# Patient Record
Sex: Male | Born: 1975 | Hispanic: Yes | Marital: Married | Smoking: Never smoker
Health system: Southern US, Community
[De-identification: ages and names within clinical notes are randomized; demographics above are authoritative.]

## PROBLEM LIST (undated history)

## (undated) DIAGNOSIS — E785 Hyperlipidemia, unspecified: Secondary | ICD-10-CM

## (undated) DIAGNOSIS — R569 Unspecified convulsions: Secondary | ICD-10-CM

## (undated) DIAGNOSIS — I10 Essential (primary) hypertension: Secondary | ICD-10-CM

## (undated) DIAGNOSIS — F39 Unspecified mood [affective] disorder: Secondary | ICD-10-CM

## (undated) HISTORY — DX: Hyperlipidemia, unspecified: E78.5

## (undated) HISTORY — DX: Unspecified convulsions: R56.9

## (undated) HISTORY — DX: Unspecified mood (affective) disorder: F39

---

## 1999-01-21 ENCOUNTER — Emergency Department (HOSPITAL_COMMUNITY): Admission: EM | Admit: 1999-01-21 | Discharge: 1999-01-21 | Payer: Self-pay | Admitting: Emergency Medicine

## 2001-12-07 HISTORY — PX: APPENDECTOMY: SHX54

## 2002-09-01 ENCOUNTER — Encounter: Payer: Self-pay | Admitting: Emergency Medicine

## 2002-09-02 ENCOUNTER — Inpatient Hospital Stay (HOSPITAL_COMMUNITY): Admission: EM | Admit: 2002-09-02 | Discharge: 2002-09-03 | Payer: Self-pay | Admitting: Emergency Medicine

## 2007-10-28 ENCOUNTER — Emergency Department (HOSPITAL_COMMUNITY): Admission: EM | Admit: 2007-10-28 | Discharge: 2007-10-28 | Payer: Self-pay | Admitting: Emergency Medicine

## 2007-11-06 ENCOUNTER — Emergency Department (HOSPITAL_COMMUNITY): Admission: EM | Admit: 2007-11-06 | Discharge: 2007-11-06 | Payer: Self-pay | Admitting: Emergency Medicine

## 2009-10-28 IMAGING — CT CT UROGRAM
2 of 3 series · 15 of 42 positions shown, 20 images · non-contrast
Comparison: NONE

CLINICAL DATA: Abdominal pain, burning with urination.  Evaluate 
for  kidney stones. 

CT UROGRAM
TECHNIQUE: Thin-section unenhanced axial images were obtained to 
provide a CT urogram to evaluate for possible urinary tract stone. 
 Scan thicknesses were 3 mm with 3-mm increments.

[Series 2: wo · axial · 0.72mm/px · z∈[+64,+421]mm · 12 of 134 slices shown, 16 images]
[im 8/134  soft-tissue]
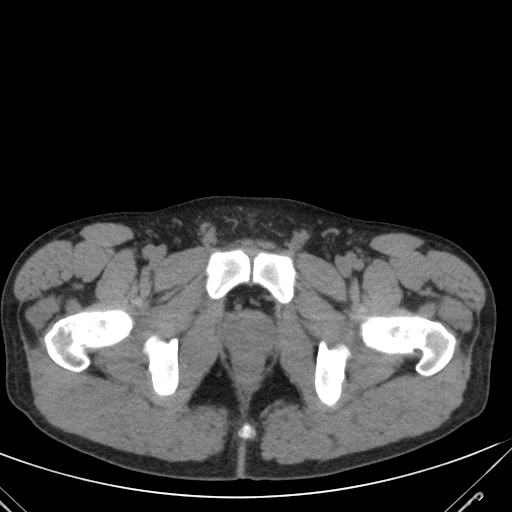
[im 8/134  bone]
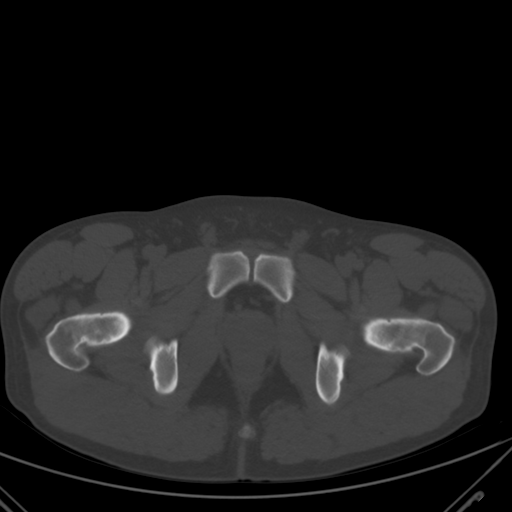
[im 22/134  soft-tissue]
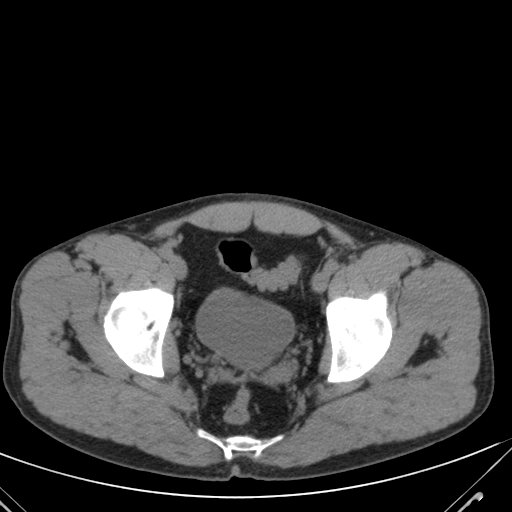
[im 36/134  soft-tissue]
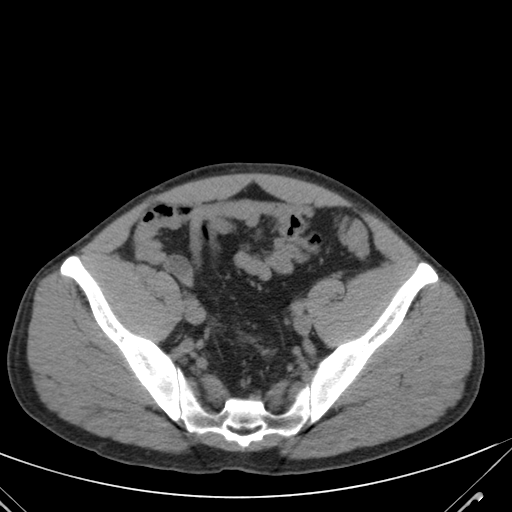
[im 50/134  soft-tissue]
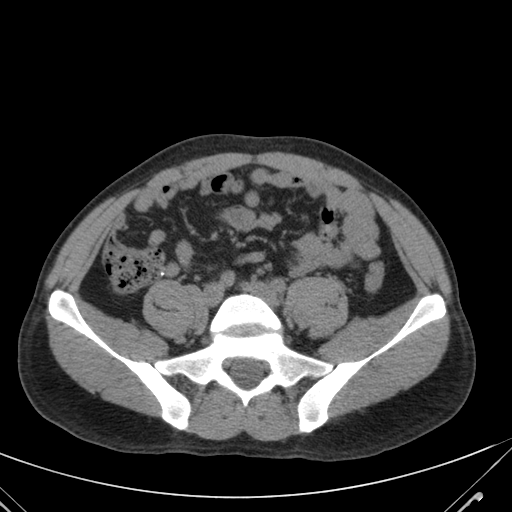
[im 64/134  soft-tissue]
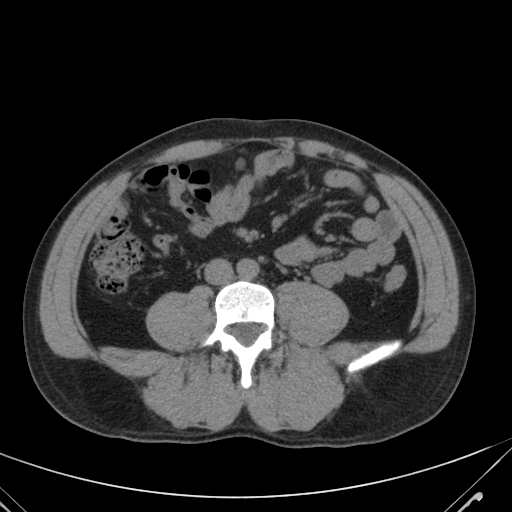
[im 71/134  soft-tissue]
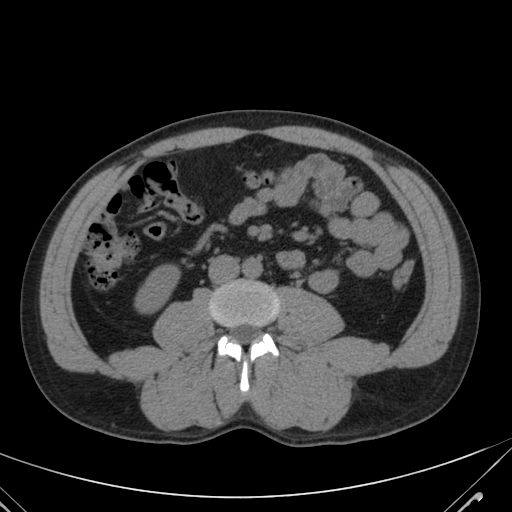
[im 85/134  soft-tissue]
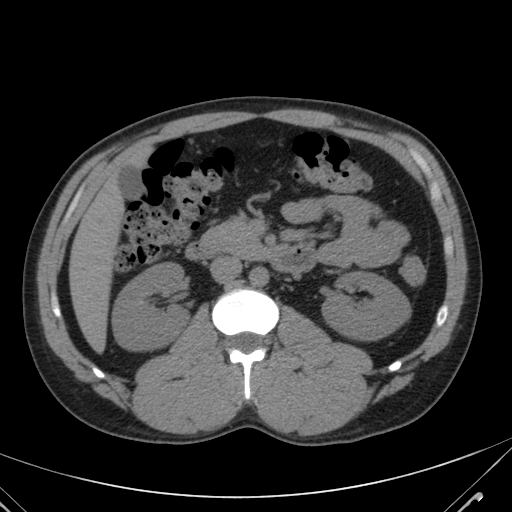
[im 99/134  soft-tissue]
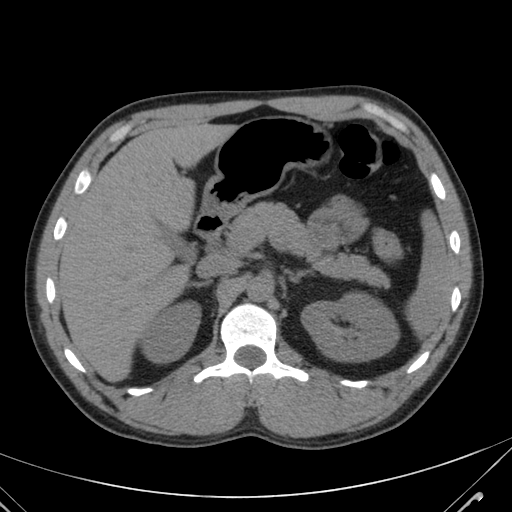
[im 106/134  lung]
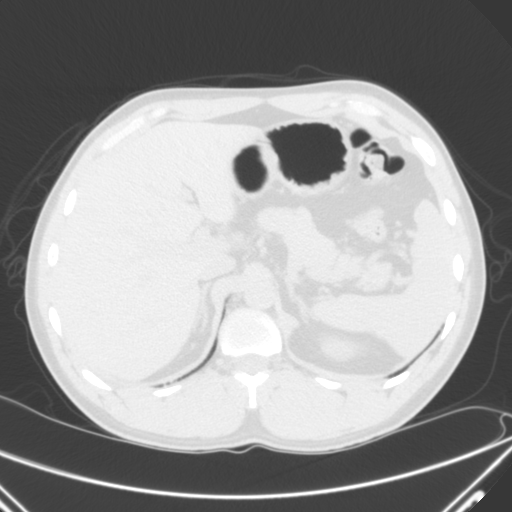
[im 113/134  soft-tissue]
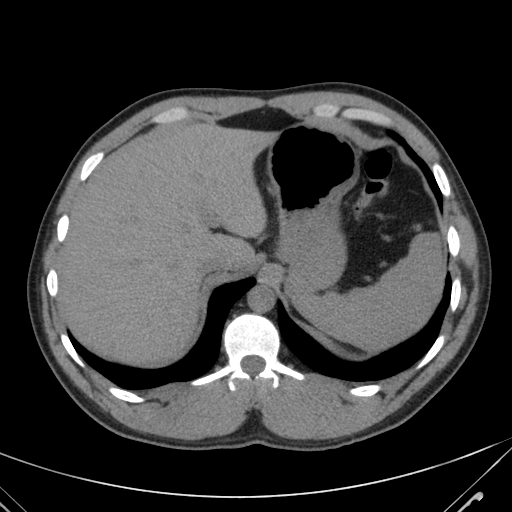
[im 113/134  lung]
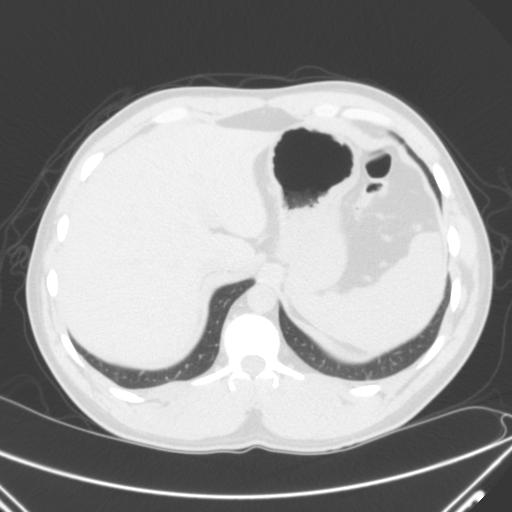
[im 113/134  bone]
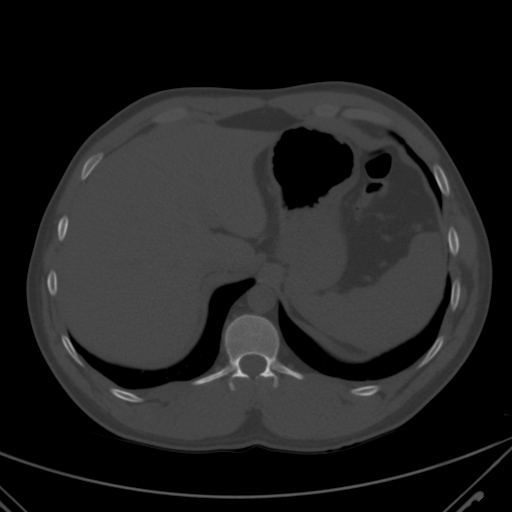
[im 120/134  lung]
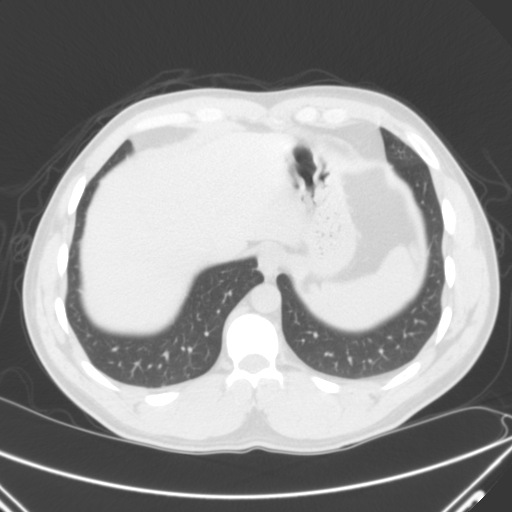
[im 127/134  soft-tissue]
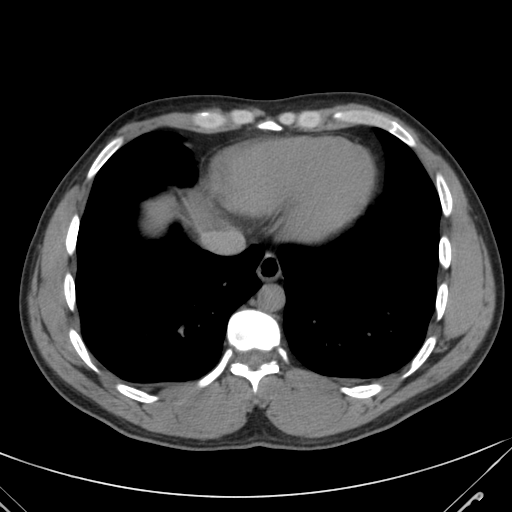
[im 127/134  lung]
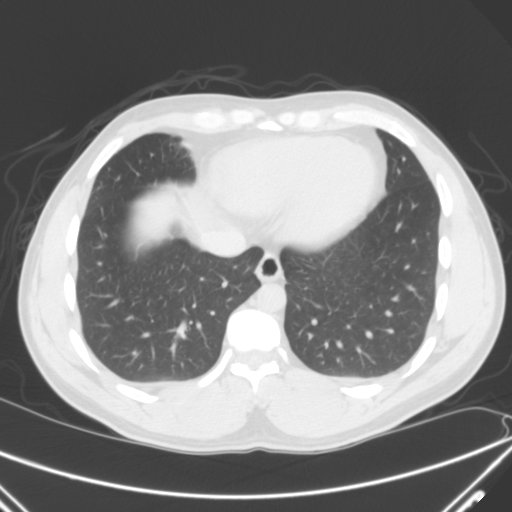

[coronals · coronal · 0.78mm/px · 3 of 86 slices shown, 4 images]
[im 29/86  soft-tissue]
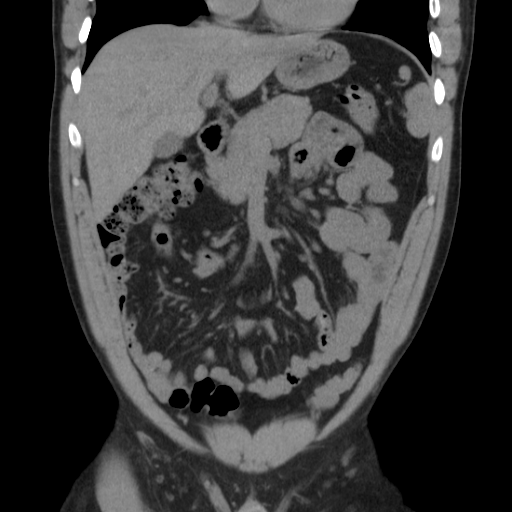
[im 38/86  soft-tissue]
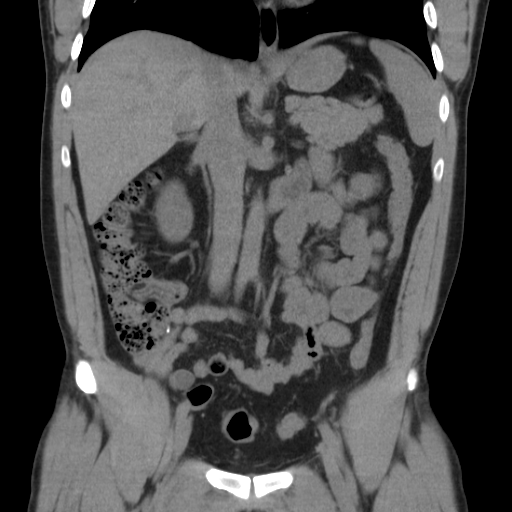
[im 38/86  bone]
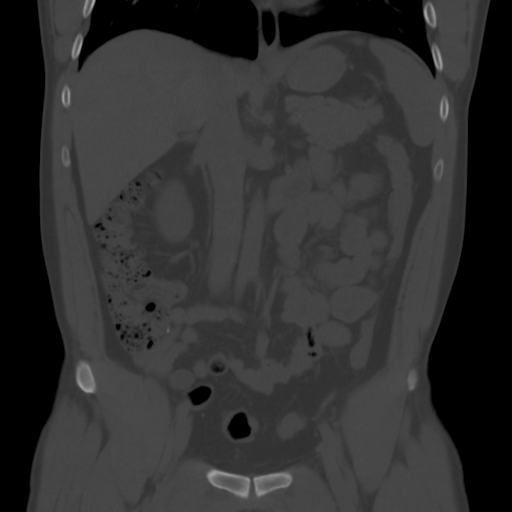
[im 48/86  soft-tissue]
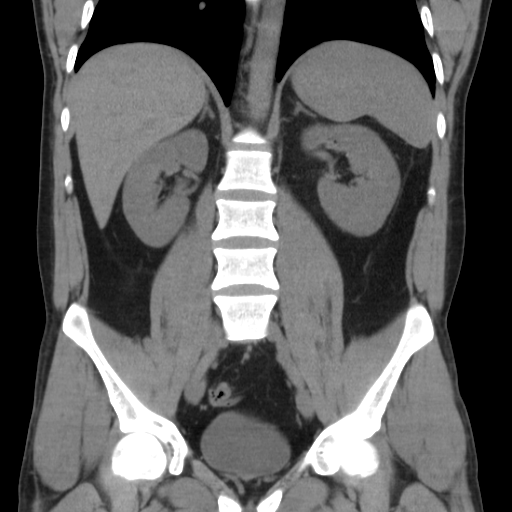

[15 of 42 positions shown; findings below may reference images not displayed]

FINDINGS: No gallstones are identified.  No renal or ureteral 
calculi.  No evidence of intrarenal or ureteral obstruction. No 
mass, adenopathy, or inflammatory process is noted in the abdomen 
or pelvis.  No evidence of appendicitis, diverticulitis, hernia, 
or bowel obstruction. No lytic or blastic lesions are identified. 
electronically reviewed on 03/22/2008 Dict Date: 03/22/2008  Tran 
Date:  03/22/2008 DAS  JLM

## 2011-01-14 ENCOUNTER — Other Ambulatory Visit: Payer: Self-pay | Admitting: Family Medicine

## 2011-01-14 DIAGNOSIS — R1011 Right upper quadrant pain: Secondary | ICD-10-CM

## 2011-01-20 ENCOUNTER — Ambulatory Visit
Admission: RE | Admit: 2011-01-20 | Discharge: 2011-01-20 | Disposition: A | Discharge status: Home / Self Care | Payer: BC Managed Care – PPO | Source: Ambulatory Visit | Attending: Family Medicine | Admitting: Family Medicine

## 2011-01-20 DIAGNOSIS — R1011 Right upper quadrant pain: Secondary | ICD-10-CM

## 2011-04-24 NOTE — Op Note (Signed)
NAME:  Brett [name]                         ACCOUNT NO.:  [id number]   MEDICAL RECORD NO.:  [id number]                   PATIENT TYPE:  OUT   LOCATION:  CATS                                 FACILITY:  [facility]   PHYSICIAN:  Marlane Hatcher, M.D.           DATE OF BIRTH:  24-Dec-1975   DATE OF PROCEDURE:  09/01/2002  DATE OF DISCHARGE:                                 OPERATIVE REPORT   PREOPERATIVE DIAGNOSIS:  Acute appendicitis.   POSTOPERATIVE DIAGNOSIS:  Acute appendicitis.   PROCEDURE:  Appendectomy.   SURGEON:  Marlane Hatcher, M.D.   SPECIMENS:  Appendix   INDICATIONS:  This is a 35 year old [name] male who came into the emergency  room with signs and symptoms of acute appendicitis.  He had a CT scan which  also revealed acute appendicitis and surgery was consulted and responded  immediately.  We discussed the surgery in Spanish with the patient in detail  discussing complications of the surgery, including, but not limited to  bleeding and infection and informed consent was obtained.   GROSS OPERATIVE FINDINGS:  Consistent with an acute suppurative  nonperforated appendicitis.   TECHNIQUE:  The patient was placed in the supine position and after the  adequate administration of general anesthesia by endotracheal intubation his  entire abdomen was prepped with Betadine solution and draped in the usual  manner.  Prior to this, a Foley catheter was aseptically inserted.  A low  transverse incision was carried out through skin and subcutaneous tissue  down to the rectus muscle and the rectus sheath was incised and the rectus  muscle was retracted medially.  The peritoneum was grasped between 2  hemostats and the abdomen was entered and explored.   The cecum appeared to be normal.  The terminal ileum appeared to be normal.  There was an acute suppurative appendicitis.  The mesoappendix was clamped  and ligated with 2-0 silk and then the appendix was amputated using  the TA  30 stapling device.  There was a little oozing around the stump of the  appendix.  This was cauterized lightly and oversewn with 3-0 Polysorb.  The  abdomen was then irrigated with normal saline solution.  We changed gloves  and then closed the peritoneum with a running #0 Polysorb and figure-of-  eight #0 Polysorb to close the fascia.  The subcu was irrigated and the skin  was closed proximally with stapling device.   Prior to closure all sponge, needle, and instrument counts were found to be  correct.  Estimated blood loss was minimal.  Patient received 1200 cc of  crystalloids intraoperatively.  There were no complications.  The patient  was taken to the intensive care unit to react.  Marlane Hatcher, M.D.    WSB/MEDQ  D:  09/01/2002  T:  09/02/2002  Job:  [id number]   cc:   Dr. Mayford Knife -- Emergency Room

## 2011-04-24 NOTE — Consult Note (Signed)
NAME:  Janae Sauce                         ACCOUNT NO.:  [id number]   MEDICAL RECORD NO.:  [id number]                   PATIENT TYPE:  INP   LOCATION:  A330                                 FACILITY:  [facility]   PHYSICIAN:  Marlane Hatcher, M.D.           DATE OF BIRTH:  08/16/1976   DATE OF CONSULTATION:  09/01/2002  DATE OF DISCHARGE:                                   CONSULTATION   HISTORY OF PRESENT ILLNESS:  Surgery was asked to see this 36 year old  [name] who came into the emergency room with abdominal pain, nausea, and  anorexia.  He gave the history of abdominal pain that was localized to the  right lower quadrant for approximately 25 hours.  He was seen earlier in the  emergency room at [facility] where he was evaluated and then later  sent to [facility] where CT scan was performed.  He then returned  back to [facility] after the CT scan revealed acute appendicitis.  Surgery was consulted and responded immediately.   PHYSICAL EXAMINATION:  GENERAL:  Pleasant 35 year old [name] male.  Uncomfortable, but in no acute distress.  VITAL SIGNS:  Weight approximately 145 pounds, height 5 feet 9 inches tall.  Blood pressure is 144/67, heart rate 76 per minute and regular, respirations  16 per minute, temperature 101.  HEENT:  Head is normocephalic.  Eyes:  Extraocular movements are intact.  Pupils are round and reactive to light and accommodation.  There is no  conjunctival pallor, or scleral injection.  The sclerae are normal tincture.  NECK:  There are no bruits auscultated.  Neck is supple and cylindrical.  There is no thyromegaly, no tracheal deviation, and no cervical adenopathy.  CHEST:  Clear both anterior and posterior to auscultation.  HEART:  Regular rhythm.  ABDOMEN:  The bowel sounds are present.  The patient is tender in the right  lower quadrant and exhibits guarding.  He has no femoral or inguinal hernias  appreciated.  RECTAL:   Prostate is smooth, and stool is guaiac-negative.  EXTREMITIES:  Within normal limits.   PAST MEDICAL HISTORY:  The patient gave a history of having epilepsy or  seizures in [place].  He was treated with Dilantin.  He was seen by a doctor  there, and he was taken off of his Dilantin and has not had a seizure for  three years.  Other than that, he has had no other positive medical history.   PAST SURGICAL HISTORY:  None.   MEDICATIONS:  Takes no medications on a regular basis.   ALLERGIES:  No known allergies.   REVIEW OF SYSTEMS:  Continued:  GASTROINTESTINAL:  Nausea, anorexia, right  lower quadrant pain.  No past history of inflammatory bowel disease or  hepatitis or alcohol abuse.  GENITOURINARY:  No dysuria or frequency.  CARDIORESPIRATORY:  Negative.  ENDOCRINE:  No history of diabetes or thyroid  disease.  MUSCULOSKELETAL:  Negative.  NEUROLOGIC:  Past history of  seizures, as explained above.  On no medication at present.   LABORATORY DATA:  The patient has a white count of 12.8 with 90%  neutrophils.  His electrolytes are within normal limits.  His urinalysis is  grossly within normal limits.  Liver function studies:  He has mildly  elevated ALTs, bilirubin 1.8.   CT scan is positive for acute appendicitis without abscess.   IMPRESSION:  Acute appendicitis.   PLAN:  Maintain n.p.o.  Continue hydration.  We have begun antibiotics with  Cipro 200 mg IV.  We will plan for a laparotomy and appendectomy as soon as  possible.  The procedures, including but not limited to, bleeding and  infection were explained in detail, and informed consent was obtained.  All  of this was explained to both the patient and his wife and explained  thoroughly in [place].  All questions were answered.                                               Marlane Hatcher, M.D.    WSB/MEDQ  D:  09/01/2002  T:  09/02/2002  Job:  [id number]   cc:   Alvira Philips, M.D.

## 2011-04-24 NOTE — Discharge Summary (Signed)
NAME:  [name], [name]                         ACCOUNT NO.:  [id number]   MEDICAL RECORD NO.:  [id number]                   PATIENT TYPE:  INP   LOCATION:  A330                                 FACILITY:  [facility]   PHYSICIAN:  Barbaraann Barthel, MD                DATE OF BIRTH:  [birth year]   DATE OF ADMISSION:  09/01/2002  DATE OF DISCHARGE:  09/03/2002                                 DISCHARGE SUMMARY   DIAGNOSIS:  Acute appendicitis.   PROCEDURE:  On September 01, 2002 appendectomy.   HISTORY OF PRESENT ILLNESS:  The patient is a 35 year old [name] male who  came into the emergency room with signs and symptoms of acute appendicitis,  he had a CT scan which also revealed acute appendicitis, surgery was  consulted and he was taken to surgery that night after hydration and  antibiotic initiation where acute suppurative nonperforated appendicitis was  encountered.  He underwent an open appendectomy without complications and  there were no other abnormalities found at laparotomy.  He was discharged on  September 03, 2002 on the second postoperative day.   HOSPITAL COURSE:  The patient's hospital course was unremarkable.  He was  discharged on the second postoperative day.  At the time that he was  discharged he was afebrile, his wounds were clean and he was tolerating p.o.  well and he was doing well with minimal discomfort.   LABORATORY DATA:  On September 01, 2002, his white count was 12.8 with an  H&H of 15.8 and 45.0.  On September 02, 2002, his white count had gone down  to 7.7 with an H&H of 14.4 and 41.8 and his metabolic set was all grossly  within normal limits as was his SMA12.  His urinalysis was also grossly  within normal limits.  Pathology report revealed acute appendicitis.   DISCHARGE INSTRUCTIONS:  The patient was excused from work.  He was  discharged on a full liquid and soft diet.  He is told to refrain from doing  any heavy lifting or straining or driving and  sexually activity.  He is  permitted to shower.  He is permitted to walk indoors and up stairs and down  stairs.  He is discharged on Cipro 500 mg two times a day.  He is also  discharged with Darvocet-N 100 one tab every 4 hours as needed for pain.  He  is told to refrain from using any aspirin or any Ex-Lax or harsh cathartics.  He is told to clean his wound with alcohol three times a day and  arrangements were made for followup with my office.  All this was explained  to this [name] patient in Spanish with all questions being answered.  Barbaraann Barthel, MD    WB/MEDQ  D:  09/25/2002  T:  09/26/2002  Job:  [id number]

## 2012-03-21 ENCOUNTER — Ambulatory Visit (INDEPENDENT_AMBULATORY_CARE_PROVIDER_SITE_OTHER): Payer: BC Managed Care – PPO | Admitting: Physician Assistant

## 2012-03-21 VITALS — BP 102/67 | HR 60 | Temp 98.1°F | Resp 16 | Ht 67.0 in | Wt 161.6 lb

## 2012-03-21 DIAGNOSIS — E785 Hyperlipidemia, unspecified: Secondary | ICD-10-CM

## 2012-03-21 DIAGNOSIS — G40909 Epilepsy, unspecified, not intractable, without status epilepticus: Secondary | ICD-10-CM

## 2012-03-21 DIAGNOSIS — F39 Unspecified mood [affective] disorder: Secondary | ICD-10-CM

## 2012-03-21 MED ORDER — LAMOTRIGINE 200 MG PO TABS: 200.0000 mg | ORAL_TABLET | Freq: Every day | ORAL | Status: DC

## 2012-03-21 NOTE — Patient Instructions (Signed)
If you have not heard about your lab results in 2 weeks, please call the office.

## 2012-03-22 ENCOUNTER — Encounter: Payer: Self-pay | Admitting: Physician Assistant

## 2012-03-22 LAB — COMPREHENSIVE METABOLIC PANEL
ALT: 20 U/L (ref 0–53)
AST: 21 U/L (ref 0–37)
Creat: 0.75 mg/dL (ref 0.50–1.35)
Total Bilirubin: 0.9 mg/dL (ref 0.3–1.2)

## 2012-03-22 LAB — LIPID PANEL
Cholesterol: 217 mg/dL — ABNORMAL HIGH (ref 0–200)
HDL: 43 mg/dL (ref >39)
Total CHOL/HDL Ratio: 5 Ratio
VLDL: 18 mg/dL (ref 0–40)

## 2012-03-22 NOTE — Progress Notes (Signed)
  Subjective:    Patient ID: Marvel Plan, male    DOB: 04/25/1976, 36 y.o.   MRN: [id number]  HPI Presents for refills of Lamictal for control of seizure disorder.  No seizures x 6 years.  Tolerating current dose well, though has been out of medication x 1 week.  Also, has history of hyperlipidemia, but it was discontinued after his last visit showed significant improvement and he had made healthy lifestyle changes.  He is fasting today to find out if he has been able to keep it controlled.   Review of Systems No chest pain, SOB, HA, dizziness, vision change, N/V, diarrhea, dysuria, myalgias, arthralgias or rash.     Objective:   Physical Exam Vital signs noted. Well-developed, well nourished Hispanic man who is awake, alert and oriented, in NAD. HEENT: [place]/AT, PERRL, EOMI.  Sclera and conjunctiva are clear.  EAC are patent, TMs are normal in appearance. Nasal mucosa is pink and moist. OP is clear. Neck: supple, non-tender, no lymphadenopathey, thyromegaly. Heart: RRR, no murmur Lungs: CTA Extremities: no cyanosis, clubbing or edema. Skin: warm and dry without rash.     Assessment & Plan:   1. Seizure disorder  Lamotrigine level, Comprehensive metabolic panel, lamoTRIgine (LAMICTAL) 200 MG tablet  2. Dyslipidemia  Comprehensive metabolic panel, Lipid panel  3. Mood disorder     Await lab results.  Continue healthy eating and regular exercise. Recheck in 6 months, sooner if indicated by labs.

## 2012-03-23 LAB — LAMOTRIGINE LEVEL

## 2012-03-23 NOTE — Progress Notes (Signed)
Addended by: Fernande Bras on: 03/23/2012 03:31 PM   Modules accepted: Orders

## 2012-08-27 IMAGING — US US ABDOMEN COMPLETE
1 series · 14 of 25 positions shown · non-contrast
Comparison: None.

CLINICAL DATA: Right upper quadrant pain for 6 months,
constipation

COMPLETE ABDOMINAL ULTRASOUND

[Series 1: us abdomen complete · 0.24mm/px · 14 of 55 slices shown]
[im 1/55]
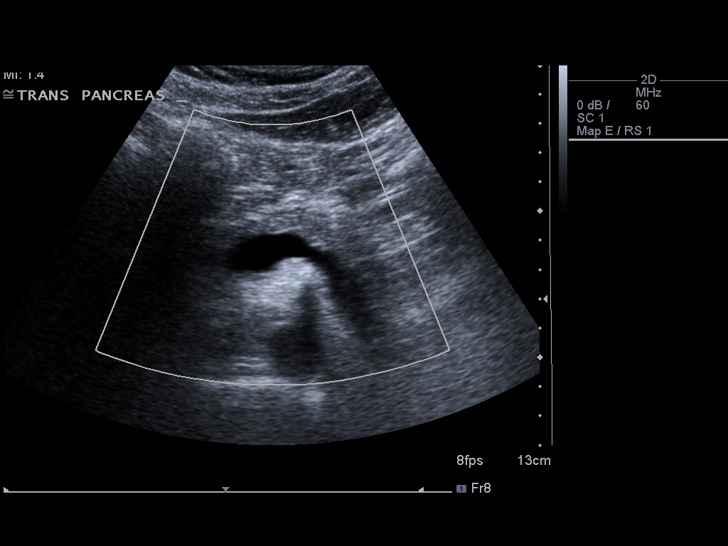
[im 5/55]
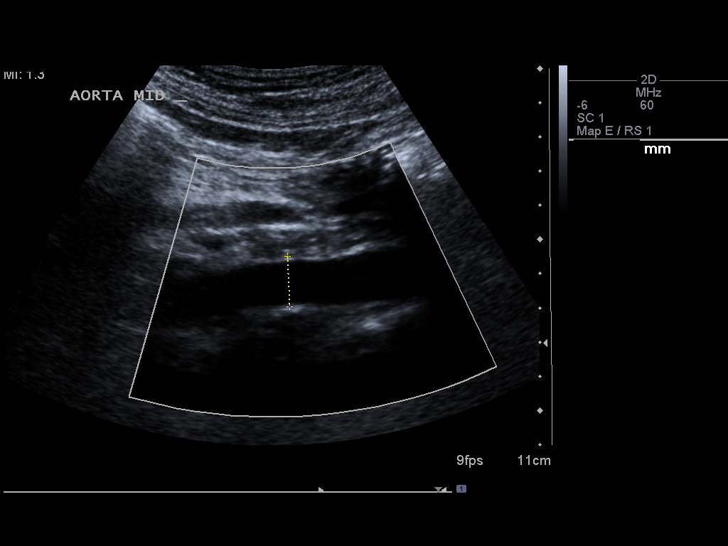
[im 10/55]
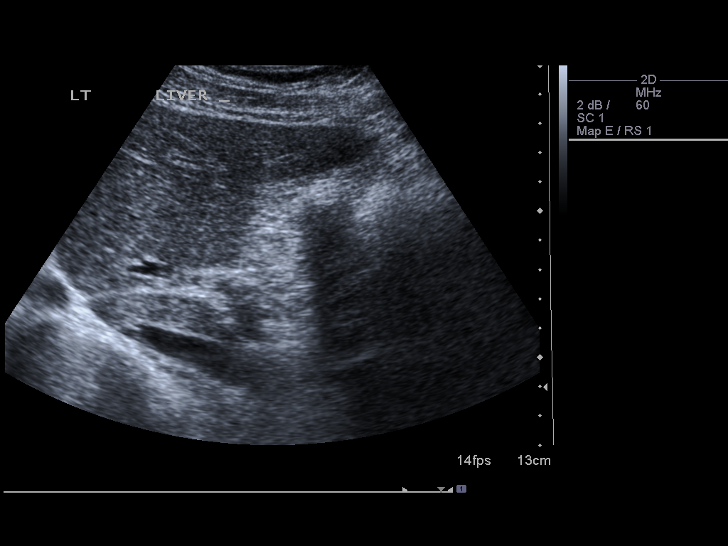
[im 14/55]
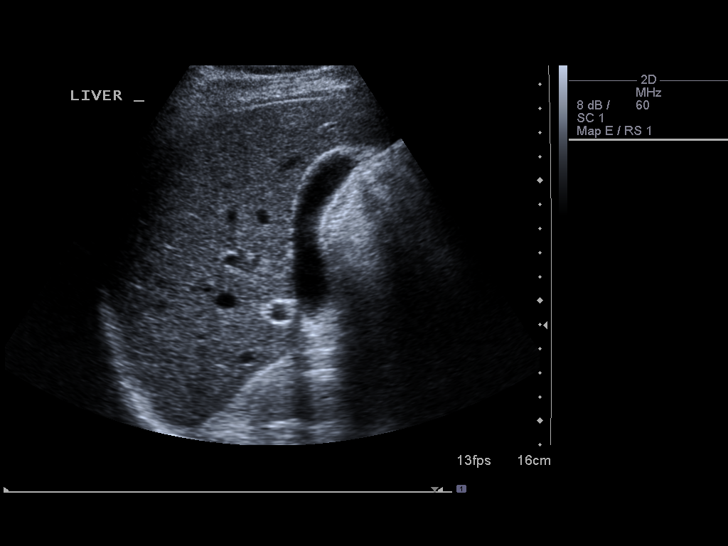
[im 19/55]
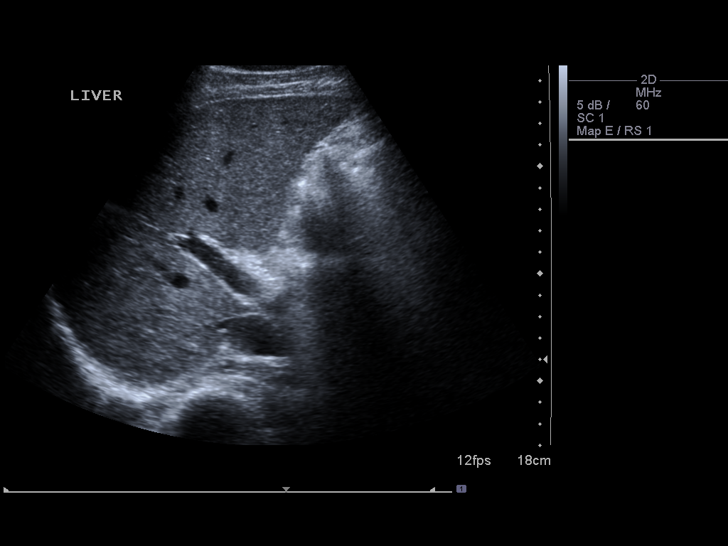
[im 21/55]
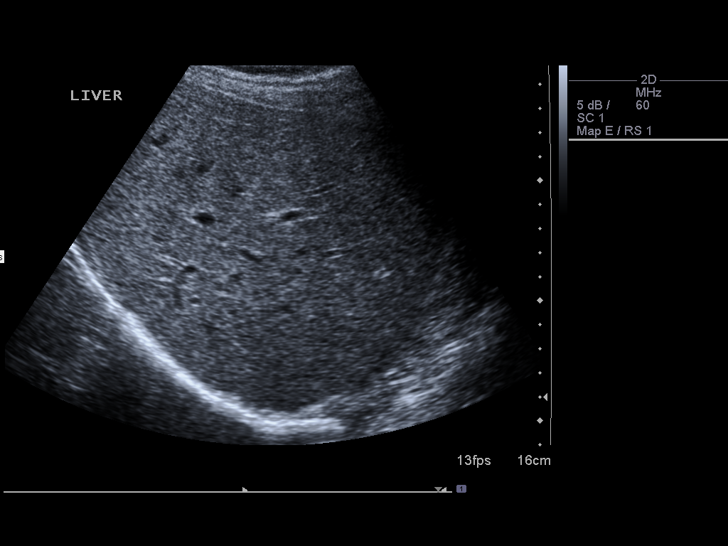
[im 25/55]
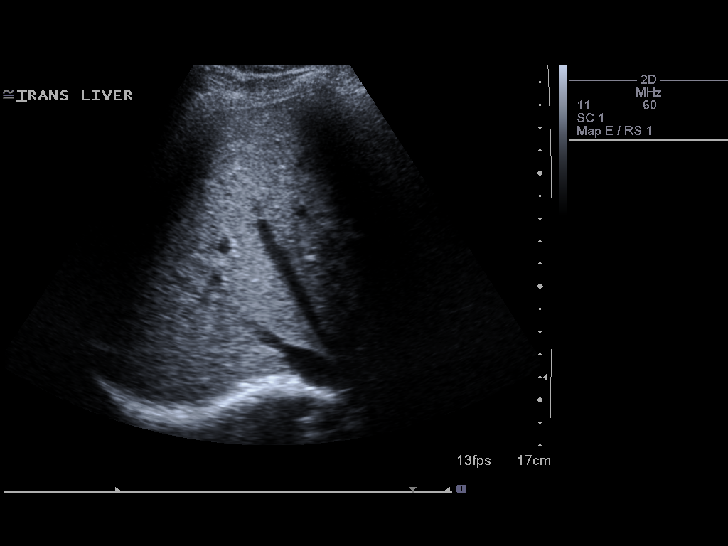
[im 30/55]
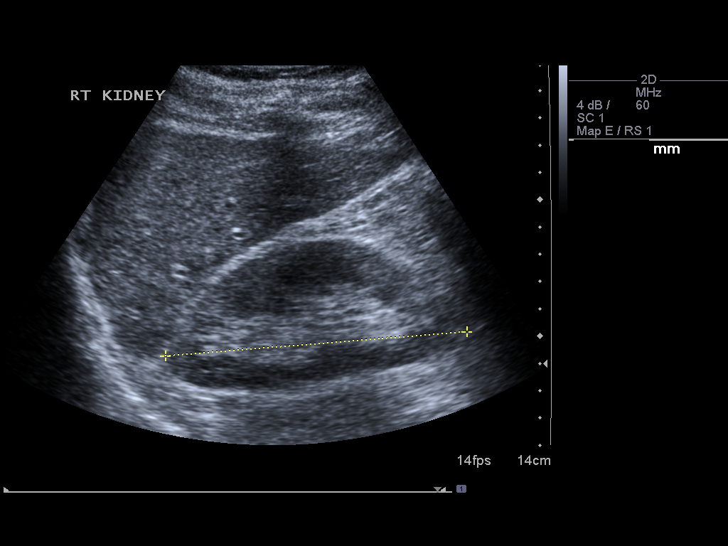
[im 34/55]
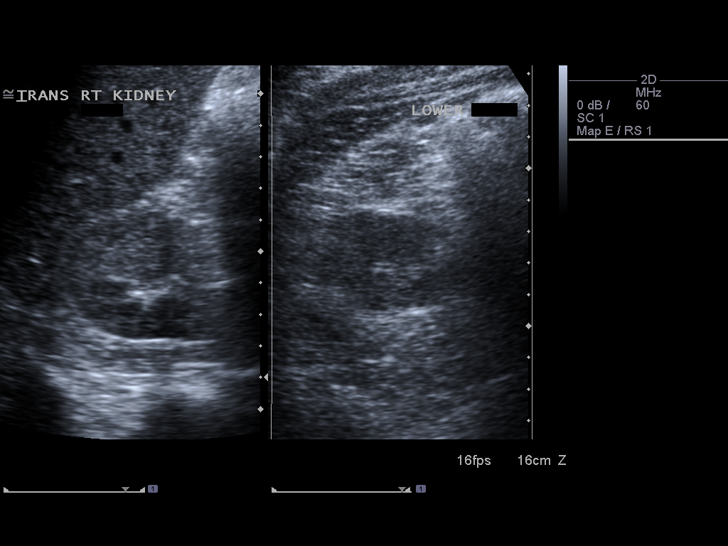
[im 37/55]
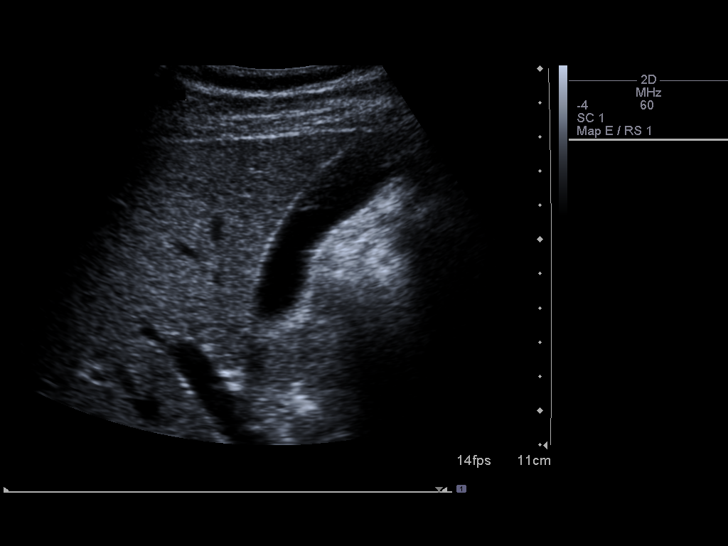
[im 41/55]
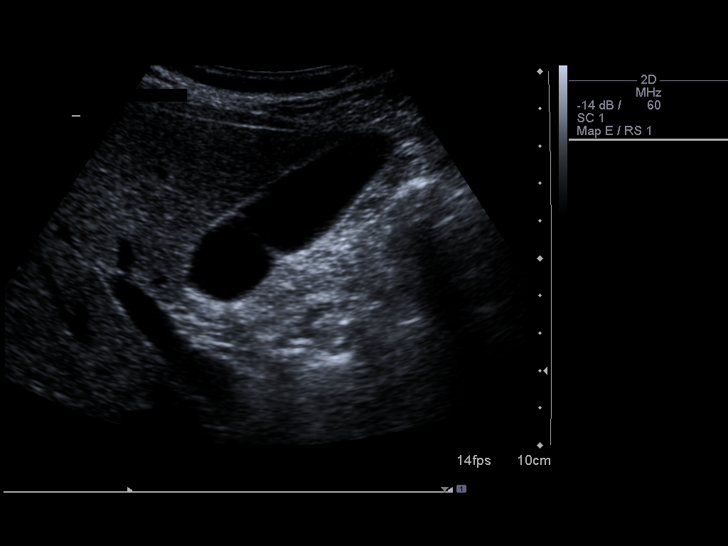
[im 46/55]
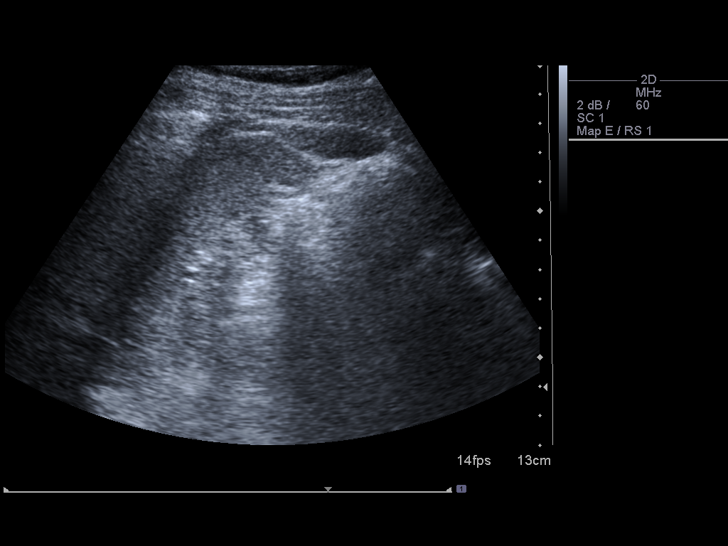
[im 50/55]
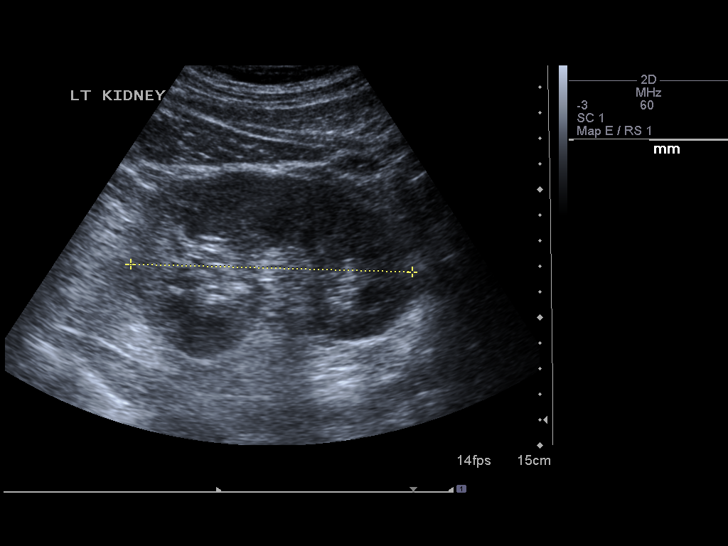
[im 55/55]
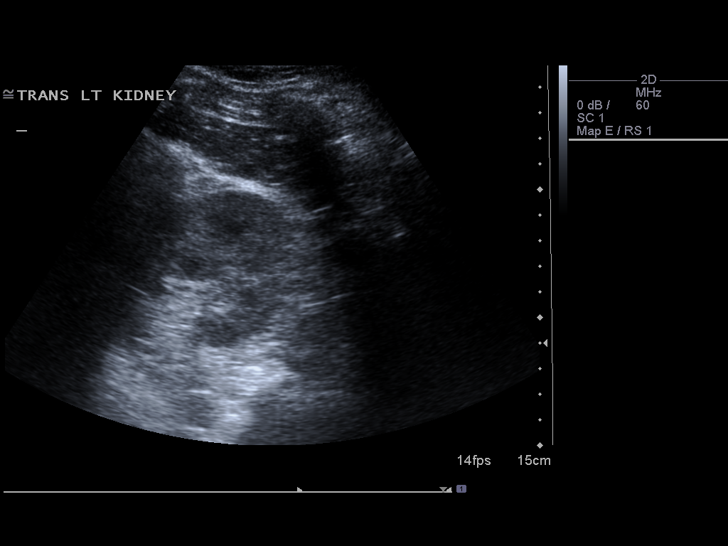

[14 of 25 positions shown; findings below may reference images not displayed]

FINDINGS: Gallbladder:  The gallbladder is visualized.  No gallstones are
seen.  There is no pain over the gallbladder with compression.

Common bile duct:  The common bile duct is normal measuring 3.1 mm
in diameter.

Liver:  The liver has a normal echogenic pattern.  No ductal
dilatation is seen.

IVC:  Appears normal.

Pancreas:  No focal abnormality seen.

Spleen:  The spleen is normal measuring 4.6 cm sagittally.

Right Kidney:  No hydronephrosis is seen.  The right kidney
measures 11.4 cm sagittally.

Left Kidney:  No hydronephrosis.  The left kidney measures 11.0 cm.

Abdominal aorta:  The abdominal aorta is normal in caliber.
IMPRESSION: Negative abdominal ultrasound.

## 2012-09-22 ENCOUNTER — Ambulatory Visit: Payer: BC Managed Care – PPO | Admitting: Physician Assistant

## 2012-09-27 ENCOUNTER — Encounter: Payer: Self-pay | Admitting: Physician Assistant

## 2012-09-27 ENCOUNTER — Ambulatory Visit (INDEPENDENT_AMBULATORY_CARE_PROVIDER_SITE_OTHER): Payer: BC Managed Care – PPO | Admitting: Physician Assistant

## 2012-09-27 VITALS — BP 115/66 | HR 68 | Temp 97.6°F | Resp 16 | Ht 66.5 in | Wt 168.0 lb

## 2012-09-27 DIAGNOSIS — E785 Hyperlipidemia, unspecified: Secondary | ICD-10-CM

## 2012-09-27 DIAGNOSIS — G40909 Epilepsy, unspecified, not intractable, without status epilepticus: Secondary | ICD-10-CM

## 2012-09-27 DIAGNOSIS — Z23 Encounter for immunization: Secondary | ICD-10-CM

## 2012-09-27 DIAGNOSIS — F39 Unspecified mood [affective] disorder: Secondary | ICD-10-CM

## 2012-09-27 LAB — COMPREHENSIVE METABOLIC PANEL
ALT: 18 U/L (ref 0–53)
AST: 21 U/L (ref 0–37)
Alkaline Phosphatase: 44 U/L (ref 39–117)
Chloride: 102 mEq/L (ref 96–112)
Creat: 0.91 mg/dL (ref 0.50–1.35)
Total Bilirubin: 1.6 mg/dL — ABNORMAL HIGH (ref 0.3–1.2)

## 2012-09-27 LAB — LIPID PANEL
Total CHOL/HDL Ratio: 5.8 Ratio
VLDL: 14 mg/dL (ref 0–40)

## 2012-09-27 MED ORDER — LAMOTRIGINE 200 MG PO TABS: 200.0000 mg | ORAL_TABLET | Freq: Every day | ORAL | Status: DC

## 2012-09-27 NOTE — Assessment & Plan Note (Signed)
Await labs today.  If LDL remains elevated, we'll restart the pravastatin and recheck in 3 months.  Continue healthy lifestyle changes.

## 2012-09-27 NOTE — Progress Notes (Signed)
  Subjective:    Patient ID: Brett [name], male    DOB: March 15, 1976, 36 y.o.   MRN: [id number]  HPI This 36 y.o. male presents for evaluation of hyperlipidemia.  I saw him last in 03/2012 and asked him to start pravastatin.  His Lamictal level was low, but he had been on the same dose for quite some time without seizure activity, and neurology told him he didn't need to see them anymore, that he just needed his level checked annually. Their note from 01/2011 is reviewed today and confirms prn follow-up there.  He reports that he has seen Dr. Hal [name] since his visit with me in April, and that she told him his cholesterol looked good, so he stopped the pravastatin.  I can find no documentation of that visit, or labs since our visit in April.  He reports he's made some healthy changes in his eating habits, and that he hopes his lipids remain under good control.  Has not taken pravastatin in several months.  No seizure activity.  Review of Systems No chest pain, SOB, HA, dizziness, vision change, N/V, diarrhea, constipation, dysuria, urinary urgency or frequency, myalgias, arthralgias or rash.  Past Medical History  Diagnosis Date  . Seizures   . Hyperlipidemia   . Mood disorder     Past Surgical History  Procedure Date  . Appendectomy     Prior to Admission medications   Medication Sig Start Date End Date Taking? Authorizing Provider  lamoTRIgine (LAMICTAL) 200 MG tablet Take 1 tablet (200 mg total) by mouth daily. 09/27/12  Yes Brett [name] Treat, PA-C    No Known Allergies  History   Social History  . Marital Status: Married    Spouse Name: Brett [name]    Number of Children: 3  . Years of Education: 5   Occupational History  . 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 Smoking status: Never Smoker   . Smokeless tobacco: Never Used  . Alcohol Use: Yes     sometimes  . Drug Use: No  . Sexually Active: Yes   Other Topics Concern  . Not on file   Social  History Narrative   Lives with wife and the two daughters they share.  His son lives with the son's mother.From [name], [place]; came to the [place] in 1995.    Family History  Problem Relation Age of Onset  . Diabetes Mother   . Heart disease Mother   . Mental illness Mother     depression  . Seizures Father        Objective:   Physical Exam  Blood pressure 115/66, pulse 68, temperature 97.6 F (36.4 C), temperature source Oral, resp. rate 16, height 5' 6.5" (1.689 m), weight 168 lb (76.204 kg). Body mass index is 26.71 kg/(m^2). Well-developed, well nourished Brett [name] man who is awake, alert and oriented, in NAD. HEENT: [place]/AT, sclera and conjunctiva are clear.   Neck: supple, non-tender, no lymphadenopathy, thyromegaly. Heart: RRR, no murmur Lungs: normal effort, CTA Extremities: no cyanosis, clubbing or edema. Skin: warm and dry without rash. Psychologic: good mood and appropriate affect, normal speech and behavior.     Assessment & [name]:   1. Seizure disorder  lamoTRIgine (LAMICTAL) 200 MG tablet, Lamotrigine level  2. Hyperlipidemia  Comprehensive metabolic panel, Lipid panel  3. Need for influenza vaccination  Flu vaccine greater than or equal to 3yo preservative free IM

## 2012-09-27 NOTE — Patient Instructions (Addendum)
Please make healthy eating choices and get regular exercise.  That will help your cholesterol.

## 2012-09-28 ENCOUNTER — Encounter: Payer: Self-pay | Admitting: Physician Assistant

## 2012-09-28 LAB — LAMOTRIGINE LEVEL: Lamotrigine Lvl: <2 ug/mL — ABNORMAL LOW (ref 3.0–14.0)

## 2012-09-28 MED ORDER — PRAVASTATIN SODIUM 20 MG PO TABS: 20.0000 mg | ORAL_TABLET | Freq: Every day | ORAL | Status: DC

## 2012-09-28 NOTE — Addendum Note (Signed)
Addended by: Fernande Bras on: 09/28/2012 03:28 PM   Modules accepted: Orders

## 2012-12-20 ENCOUNTER — Encounter: Payer: Self-pay | Admitting: Physician Assistant

## 2012-12-20 ENCOUNTER — Ambulatory Visit (INDEPENDENT_AMBULATORY_CARE_PROVIDER_SITE_OTHER): Payer: BC Managed Care – PPO | Admitting: Physician Assistant

## 2012-12-20 VITALS — BP 115/73 | HR 79 | Temp 96.8°F | Resp 16 | Ht 66.5 in | Wt 166.0 lb

## 2012-12-20 DIAGNOSIS — E785 Hyperlipidemia, unspecified: Secondary | ICD-10-CM

## 2012-12-20 LAB — COMPREHENSIVE METABOLIC PANEL
ALT: 16 U/L (ref 0–53)
Alkaline Phosphatase: 52 U/L (ref 39–117)
Creat: 0.79 mg/dL (ref 0.50–1.35)
Sodium: 140 mEq/L (ref 135–145)
Total Bilirubin: 1.5 mg/dL — ABNORMAL HIGH (ref 0.3–1.2)
Total Protein: 6.6 g/dL (ref 6.0–8.3)

## 2012-12-20 LAB — LIPID PANEL
LDL Cholesterol: 111 mg/dL — ABNORMAL HIGH (ref 0–99)
Total CHOL/HDL Ratio: 4.3 Ratio
Triglycerides: 89 mg/dL (ref <150)
VLDL: 18 mg/dL (ref 0–40)

## 2012-12-20 NOTE — Progress Notes (Signed)
  Subjective:    Patient ID: Brett [name], male    DOB: 1976/11/13, 37 y.o.   MRN: [id number]  HPI This 37 y.o. male presents for evaluation of hyperlipidemia.  He's been taking the pravastatin and tolerating it well without myalgias.    No seizures since his last OV (it has been years, and he's been released from neurology, advised to follow with a PCP for annual Lamictal levels)   Past Medical History  Diagnosis Date  . Seizures   . Hyperlipidemia   . Mood disorder     Past Surgical History  Procedure Date  . Appendectomy     Prior to Admission medications   Medication Sig Start Date End Date Taking? Authorizing Provider  lamoTRIgine (LAMICTAL) 200 MG tablet Take 1 tablet (200 mg total) by mouth daily. 09/27/12  Yes Zeek Rostron S Treysen Sudbeck, PA-C  pravastatin (PRAVACHOL) 20 MG tablet Take 1 tablet (20 mg total) by mouth daily. 09/28/12  Yes Alexzandrea Normington S Child Campoy, PA-C    No Known Allergies  History   Social History  . Marital Status: Married    Spouse Name: Laurelyn Sickle    Number of Children: 3  . Years of Education: 5   Occupational History  . 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 Smoking status: Never Smoker   . Smokeless tobacco: Never Used  . Alcohol Use: Yes     Comment: sometimes  . Drug Use: No  . Sexually Active: Yes   Other Topics Concern  . Not on file   Social History Narrative   Lives with wife and the two daughters they share.  His son lives with the son's mother.From [place], [place]; came to the [place] in 1995.    Family History  Problem Relation Age of Onset  . Diabetes Mother   . Heart disease Mother   . Mental illness Mother     depression  . Seizures Father      Review of Systems Denies chest pain, shortness of breath, HA, dizziness, vision change, nausea, vomiting, diarrhea, constipation, melena, hematochezia, dysuria, increased urinary urgency or frequency, increased hunger or thirst, unintentional weight change,  unexplained myalgias or arthralgias, rash.     Objective:   Physical Exam Blood pressure 115/73, pulse 79, temperature 96.8 F (36 C), resp. rate 16, height 5' 6.5" (1.689 m), weight 166 lb (75.297 kg). Body mass index is 26.39 kg/(m^2). Well-developed, well nourished [name] who is awake, alert and oriented, in NAD. HEENT: [place]/AT, sclera and conjunctiva are clear.   Neck: supple, non-tender, no lymphadenopathy, thyromegaly. Heart: RRR, no murmur Lungs: normal effort, CTA Extremities: no cyanosis, clubbing or edema. Skin: warm and dry without rash. Psychologic: good mood and appropriate affect, normal speech and behavior.     Assessment & [name]:   1. Hyperlipidemia  Comprehensive metabolic panel, Lipid panel   Continue pravastatin and Lamictal.  F/U 6 months with fasting lipids, CMET, sooner PRN and if indicated by today's labs.

## 2012-12-28 ENCOUNTER — Ambulatory Visit (INDEPENDENT_AMBULATORY_CARE_PROVIDER_SITE_OTHER): Payer: BC Managed Care – PPO | Admitting: Family Medicine

## 2012-12-28 ENCOUNTER — Encounter: Payer: Self-pay | Admitting: Family Medicine

## 2012-12-28 VITALS — BP 128/87 | HR 109 | Temp 99.1°F | Resp 16 | Ht 68.0 in | Wt 166.0 lb

## 2012-12-28 DIAGNOSIS — N419 Inflammatory disease of prostate, unspecified: Secondary | ICD-10-CM

## 2012-12-28 DIAGNOSIS — N3090 Cystitis, unspecified without hematuria: Secondary | ICD-10-CM | POA: Insufficient documentation

## 2012-12-28 DIAGNOSIS — R300 Dysuria: Secondary | ICD-10-CM

## 2012-12-28 LAB — POCT CBC
Granulocyte percent: 74.5 %G (ref 37–80)
HCT, POC: 46.8 % (ref 43.5–53.7)
Hemoglobin: 15.6 g/dL (ref 14.1–18.1)
Lymph, poc: 2.3 (ref 0.6–3.4)
MCV: 90.7 fL (ref 80–97)
POC LYMPH PERCENT: 19.4 %L (ref 10–50)
RDW, POC: 12.5 %

## 2012-12-28 LAB — POCT URINALYSIS DIPSTICK
Bilirubin, UA: NEGATIVE
Nitrite, UA: POSITIVE
Spec Grav, UA: 1.02
Urobilinogen, UA: 0.2
pH, UA: 5.5

## 2012-12-28 LAB — POCT UA - MICROSCOPIC ONLY
Casts, Ur, LPF, POC: NEGATIVE
Epithelial cells, urine per micros: NEGATIVE
Mucus, UA: NEGATIVE
Yeast, UA: NEGATIVE

## 2012-12-28 MED ORDER — CIPROFLOXACIN HCL 500 MG PO TABS: 500.0000 mg | ORAL_TABLET | Freq: Two times a day (BID) | ORAL | Status: DC

## 2012-12-28 NOTE — Assessment & Plan Note (Signed)
Patient has a bladder infection and has potential prostatitis. Patient declined having a rectal exam today. Patient's will be given ciprofloxacin and treated for potential prostatitis with this been a recurrent problem. Patient though that his last initial episode greater than 6 years ago. Patient encouraged to take in good oral hydration. Patient is red flags and when to seek medical attention. Patient will followup again in one week with his primary care provider to make sure that he continues to improve.

## 2012-12-28 NOTE — Patient Instructions (Signed)
Prostatitis  The prostate gland is about the size and shape of a walnut. It is located just below your bladder. It produces one of the components of semen, which is made up of sperm and the fluids that help nourish and transport it out from the testicles. Prostatitis is redness, soreness, and swelling (inflammation) of the prostate gland.   There are 3 types of prostatitis:   Acute bacterial prostatitis This is the least common type of prostatitis. It starts quickly and usually leads to a bladder infection. It can occur at any age.   Chronic bacterial prostatitis This is a persistent bacterial infection in the prostate.It usually develops from repeated acute bacterial prostatitis or acute bacterial prostatitis that was not properly treated. It can occur in men of any age but is most common in middle-aged men whose prostate has begun to enlarge.   Chronic prostatitis chronic pelvic pain syndrome This is the most common type of prostatitis. It is inflammation of the prostate gland that is not caused by a bacterial infection. The cause is unknown.  CAUSES  The cause of acute and chronic bacterial prostatitis is a bacterial infection. The exact cause of chronic prostatitis and chronic pelvic pain syndrome and asymptomatic inflammatory prostatitis is unknown.   SYMPTOMS   Symptoms can vary depending upon the type of prostatitis that exists. There can also be overlap in symptoms. Possible symptoms for each type of prostatitis are listed below.  Acute bacterial prostatitis   Painful urination.   Fever or chills.   Muscle or joint pains.   Low back pain.   Low abdominal pain.   Inability to empty bladder completely.   Sudden urge to urinate.   Frequent urination.   Difficulty starting urine stream.   Weak urine stream.   Discharge from the urethra.   Dribbling after urination.   Rectal pain.   Pain in the testicles, penis, or tip of the penis.   Pain in the space between the anus and scrotum  (perineum).   Problems with sexual function.   Painful ejaculation.   Bloody semen.  Chronic bacterial prostatitis   The symptoms are similar to those of acute bacterial prostatitis, but they usually are much less severe. Fever, chills, and muscle and joint pain are not associated with chronic bacterial prostatitis.  Chronic prostatitis chronic pelvic pain syndrome   Symptoms typically include a dull ache in the scrotum and the perineum.  DIAGNOSIS   In order to diagnose prostatitis, your caregiver will ask about your symptoms. If acute or chronic bacterial prostatitis is suspected, a urine sample will be taken and tested (urinalysis). This is to see if there is bacteria in your urine. If the urinalysis result is negative for bacteria, your caregiver may use a finger to feel your prostate (digital rectal exam). This exam helps your caregiver determine if your prostate is swollen and tender.  TREATMENT   Treatment for prostatitis depends on the cause. If a bacterial infection is the cause, it can be treated with antibiotic medicine. In cases of chronic bacterial prostatitis, the use of antibiotics for up to 1 month may be necessary. Your caregiver may instruct you to take sitz baths to help relieve pain. A sitz bath is a bath of hot water in which your hips and buttocks are under water.  HOME CARE INSTRUCTIONS    Take all medicines as directed by your caregiver.   Take sitz baths as directed by your caregiver.  SEEK MEDICAL CARE IF:      Your symptoms get worse, not better.   You have a fever.  SEEK IMMEDIATE MEDICAL CARE IF:    You have chills.   You feel nauseous or vomit.   You feel lightheaded or faint.   You are unable to urinate.   You have blood or blood clots in your urine.  Document Released: 11/20/2000 Document Revised: 02/15/2012 Document Reviewed: 10/26/2011  [employer] Patient Information 2013 [employer].

## 2012-12-28 NOTE — Progress Notes (Signed)
  Subjective:    Patient ID: Brett [name], male    DOB: March 18, 1976, 37 y.o.   MRN: [id number]  HPI Patient is a 37 year old male coming in with 9 day history of feeling fatigue, low-grade temp, as well as abdominal pain with some dysuria. Patient states that these have all had an insidious onset. Patient denies any significant sick contacts but did have a cousin who visit him was diagnosed with a virus. Patient states that what seems to be the worse at this point if the abdominal pain with the dysuria. Patient states he did have a past medical history significant for this 6 years ago. Patient did see a specialist who said that he did have a urinary tract infection but other than that no other abnormalities. Patient though is feeling worse and worse and thinks he may have another infection.  Past medical history, social, surgical and family history all reviewed.     Review of Systems Review of systems does include fever, chills, abdominal pain, dysuria. He denies any shortness of breath or chest pain.    Objective:   Physical Exam General: No apparent distress alert and oriented x3 mood and affect normal HEENT: Pupils equal round reactive to light and accommodation, dry mucous membranes Cardiovascular: Regular rate and rhythm no murmur appreciated Pulmonary: Clear to auscultation bilaterally Abdominal exam: Bowel sounds positive patient though is tender to palpation in the suprapubic region. Otherwise unremarkable. Patient has a negative Murphy's and negative McBurney sign. Patient has no CVA tenderness Extremities: Patient has 5 out of 5 strength of all extremities and neurovascularly intact with good capillary refill.  Lab Results  Component Value Date   WBC 11.8* 12/28/2012   HGB 15.6 12/28/2012   HCT 46.8 12/28/2012   MCV 90.7 12/28/2012   Urinalysis is positive for blood, nitrates, leukocytes. Microscopy shows 30-40 white blood cell count as well as 3+ bacteria      Assessment &  [name]:

## 2012-12-30 LAB — GC/CHLAMYDIA PROBE AMP, URINE: GC Probe Amp, Urine: NEGATIVE

## 2012-12-31 LAB — URINE CULTURE

## 2013-01-03 NOTE — Progress Notes (Signed)
Below history and physical exam reviewed in detail with Dr. Antoine Primas.  Agree with current assessment and plan.  KMS

## 2013-05-20 ENCOUNTER — Other Ambulatory Visit: Payer: Self-pay | Admitting: Physician Assistant

## 2013-06-20 ENCOUNTER — Ambulatory Visit: Payer: BC Managed Care – PPO | Admitting: Physician Assistant

## 2013-07-18 ENCOUNTER — Ambulatory Visit (INDEPENDENT_AMBULATORY_CARE_PROVIDER_SITE_OTHER): Payer: BC Managed Care – PPO | Admitting: Physician Assistant

## 2013-07-18 ENCOUNTER — Encounter: Payer: Self-pay | Admitting: Physician Assistant

## 2013-07-18 VITALS — BP 110/70 | HR 64 | Temp 97.9°F | Resp 16 | Ht 66.75 in | Wt 166.4 lb

## 2013-07-18 DIAGNOSIS — E785 Hyperlipidemia, unspecified: Secondary | ICD-10-CM

## 2013-07-18 DIAGNOSIS — G40909 Epilepsy, unspecified, not intractable, without status epilepticus: Secondary | ICD-10-CM

## 2013-07-18 MED ORDER — LAMOTRIGINE 200 MG PO TABS: 200.0000 mg | ORAL_TABLET | Freq: Every day | ORAL | Status: DC

## 2013-07-18 NOTE — Patient Instructions (Addendum)
It is important that you take your medications EVERY SINGLE DAY. Please return for re-evaluation and FASTING lab work in 3 months.

## 2013-07-18 NOTE — Progress Notes (Signed)
I have examined this patient along with the student and agree.  

## 2013-07-18 NOTE — Progress Notes (Signed)
  Subjective:    Patient ID: Brett [name], male    DOB: [birth year], 37 y.o.   MRN: [id number]  HPI 37 y.o. Male presents to clinic today for follow up on hyperlipidemia and seizures and medication refill. Patient is doing well and has no complaints today. He has been out of his Pravastatin for 3 weeks now. He states he picked up a new refill but lost it at work. He has continued to take his Lamictal. He has not had any recent seizures. His last Lamictal level was low and he was advised to go see a Neurologist for further evaluation which he has not done so we will check his levels again today.    Review of Systems  Constitutional: Negative for fever, chills, activity change, appetite change and unexpected weight change.  HENT: Negative for congestion, sore throat and sinus pressure.   Eyes: Negative for visual disturbance.  Respiratory: Negative for cough and shortness of breath.   Cardiovascular: Negative for chest pain and leg swelling.  Gastrointestinal: Negative for vomiting, abdominal pain, diarrhea and constipation.  Genitourinary: Negative for dysuria, urgency, frequency, decreased urine volume and difficulty urinating.  Musculoskeletal: Negative for myalgias and arthralgias.  Skin: Negative for rash.  Neurological: Negative for headaches.  All other systems reviewed and are negative.       Objective:   Physical Exam  Nursing note and vitals reviewed. Constitutional: He is oriented to person, place, and time. Vital signs are normal. He appears well-developed and well-nourished. No distress.  HENT:  Head: Normocephalic and atraumatic.  Right Ear: External ear normal.  Left Ear: External ear normal.  Nose: Nose normal.  Eyes: Conjunctivae and lids are normal.  Neck: Trachea normal and normal range of motion. Neck supple. No thyromegaly present.  Cardiovascular: Normal rate, regular rhythm, normal heart sounds and normal pulses.   Pulmonary/Chest: Effort normal and breath  sounds normal.  Musculoskeletal: Normal range of motion.  Lymphadenopathy:    He has no cervical adenopathy.  Neurological: He is alert and oriented to person, place, and time.  Skin: Skin is warm and dry. No rash noted. He is not diaphoretic.  Psychiatric: He has a normal mood and affect. His speech is normal and behavior is normal. Judgment and thought content normal. Cognition and memory are normal.        Assessment & [name]:  Hyperlipidemia- Continue to take Pravastatin as directed.   Seizure disorder - [name]: Lamotrigine level, lamoTRIgine (LAMICTAL) 200 MG tablet. Patient to continue to take current prescription as directed. We tested labs today and will notify him if any changes need to be made or if we feel that a referral to Neurologist is necessary.  We will contact patient with the results of his labs once they are available.  Instructed him to please return for re-evaluation and fasting lab work in 3 months.

## 2013-07-19 LAB — LAMOTRIGINE LEVEL: Lamotrigine Lvl: <2 ug/mL — ABNORMAL LOW (ref 3.0–14.0)

## 2013-10-24 ENCOUNTER — Ambulatory Visit (INDEPENDENT_AMBULATORY_CARE_PROVIDER_SITE_OTHER): Payer: BC Managed Care – PPO | Admitting: Physician Assistant

## 2013-10-24 ENCOUNTER — Encounter: Payer: Self-pay | Admitting: Physician Assistant

## 2013-10-24 VITALS — BP 118/70 | HR 79 | Temp 98.4°F | Resp 16 | Ht 67.0 in | Wt 167.0 lb

## 2013-10-24 DIAGNOSIS — E785 Hyperlipidemia, unspecified: Secondary | ICD-10-CM

## 2013-10-24 DIAGNOSIS — R7309 Other abnormal glucose: Secondary | ICD-10-CM

## 2013-10-24 DIAGNOSIS — G40909 Epilepsy, unspecified, not intractable, without status epilepticus: Secondary | ICD-10-CM

## 2013-10-24 DIAGNOSIS — R739 Hyperglycemia, unspecified: Secondary | ICD-10-CM

## 2013-10-24 LAB — COMPREHENSIVE METABOLIC PANEL
ALT: 17 U/L (ref 0–53)
AST: 18 U/L (ref 0–37)
CO2: 27 mEq/L (ref 19–32)
Creat: 0.86 mg/dL (ref 0.50–1.35)
Sodium: 138 mEq/L (ref 135–145)
Total Bilirubin: 1.1 mg/dL (ref 0.3–1.2)
Total Protein: 6.2 g/dL (ref 6.0–8.3)

## 2013-10-24 LAB — LIPID PANEL
Cholesterol: 161 mg/dL (ref 0–200)
HDL: 35 mg/dL — ABNORMAL LOW (ref >39)
LDL Cholesterol: 104 mg/dL — ABNORMAL HIGH (ref 0–99)
Total CHOL/HDL Ratio: 4.6 Ratio
VLDL: 22 mg/dL (ref 0–40)

## 2013-10-24 LAB — GLUCOSE, POCT (MANUAL RESULT ENTRY): POC Glucose: 104 mg/dl — AB (ref 70–99)

## 2013-10-24 LAB — POCT GLYCOSYLATED HEMOGLOBIN (HGB A1C): Hemoglobin A1C: 5.5

## 2013-10-24 NOTE — Patient Instructions (Addendum)
Your blood sugar was mildly elevated again, but the hemoglobin A1C was normal, which is good.  You need to continue your efforts for healthy eating and staying active, to prevent your blood sugar from rising more.    I will contact you with your lab results as soon as they are available.   If you have not heard from me in 2 weeks, please contact me.  The fastest way to get your results is to register for My Chart (see the instructions on the last page of this printout).

## 2013-10-24 NOTE — Progress Notes (Signed)
    Subjective:    Patient ID: Brett [name], male    DOB: 07-14-1976, 37 y.o.   MRN: [id number]  HPI This 37 y.o. male presents for evaluation of hyperlipidemia.  He also would like to update his fasting glucose-recall that he has an elevated fasting glucose about a year ago, with normal readings since.  His mother has DM, so he'd like update that today.  He has had no seizures since his last visit, despite a low lamictal level, and has chosen not to follow-up with neurology.  Medications, allergies, past medical history, surgical history, family history, social history and problem list reviewed.   Review of Systems Denies chest pain, shortness of breath, HA, dizziness, vision change, nausea, vomiting, diarrhea, constipation, melena, hematochezia, dysuria, increased urinary urgency or frequency, increased hunger or thirst, unintentional weight change, unexplained myalgias or arthralgias, rash.     Objective:   Physical Exam Blood pressure 118/70, pulse 79, temperature 98.4 F (36.9 C), resp. rate 16, height 5\' 7"  (1.702 m), weight 167 lb (75.751 kg). Body mass index is 26.15 kg/(m^2). Well-developed, well nourished Hispanic male who is awake, alert and oriented, in NAD. HEENT: [place]/AT, sclera and conjunctiva are clear.   Neck: supple, non-tender, no lymphadenopathy, thyromegaly. Heart: RRR, no murmur Lungs: normal effort, CTA Extremities: no cyanosis, clubbing or edema. Skin: warm and dry without rash. Psychologic: good mood and appropriate affect, normal speech and behavior.    Results for orders placed in visit on 10/24/13  GLUCOSE, POCT (MANUAL RESULT ENTRY)      Result Value Range   POC Glucose 104 (*) 70 - 99 mg/dl  POCT GLYCOSYLATED HEMOGLOBIN (HGB A1C)      Result Value Range   Hemoglobin A1C 5.5         Assessment & [name]:  Hyperlipidemia - [name]: Comprehensive metabolic panel, Lipid panel  Seizure disorder - remains seizure free, despite low lamictal level, and  repeatedly not scheduled with neurology referrals.  Hyperglycemia - [name]: POCT glucose (manual entry), POCT glycosylated hemoglobin (Hb A1C); continue efforts for healthy eating and activity.    Fernande Bras, PA-C Physician Assistant-Certified [facility]

## 2013-10-25 ENCOUNTER — Encounter: Payer: Self-pay | Admitting: Physician Assistant

## 2013-10-25 MED ORDER — PRAVASTATIN SODIUM 20 MG PO TABS: 20.0000 mg | ORAL_TABLET | Freq: Every day | ORAL | Status: DC

## 2013-10-25 NOTE — Addendum Note (Signed)
Addended by: Fernande Bras on: 10/25/2013 08:26 PM   Modules accepted: Orders

## 2014-03-11 ENCOUNTER — Other Ambulatory Visit: Payer: Self-pay | Admitting: Physician Assistant

## 2014-04-24 ENCOUNTER — Ambulatory Visit: Payer: BC Managed Care – PPO | Admitting: Physician Assistant

## 2014-09-11 ENCOUNTER — Other Ambulatory Visit: Payer: Self-pay | Admitting: Physician Assistant

## 2014-10-09 ENCOUNTER — Telehealth: Payer: Self-pay

## 2014-10-09 MED ORDER — LAMOTRIGINE 200 MG PO TABS: 200.0000 mg | ORAL_TABLET | Freq: Every day | ORAL | Status: DC

## 2014-10-09 NOTE — Telephone Encounter (Signed)
Pharm called to verify fax he got back from us and I had him change fax # to [phone] in his system to see if that helps reqs send electronically. OKd #15 RF of Lamictal w/note "no more rfs w/out OV".

## 2014-11-15 ENCOUNTER — Other Ambulatory Visit: Payer: Self-pay | Admitting: Physician Assistant

## 2014-11-16 ENCOUNTER — Ambulatory Visit (INDEPENDENT_AMBULATORY_CARE_PROVIDER_SITE_OTHER): Payer: BC Managed Care – PPO | Admitting: Physician Assistant

## 2014-11-16 VITALS — BP 118/70 | HR 69 | Temp 98.1°F | Resp 18 | Ht 66.5 in | Wt 169.6 lb

## 2014-11-16 DIAGNOSIS — R739 Hyperglycemia, unspecified: Secondary | ICD-10-CM

## 2014-11-16 DIAGNOSIS — R350 Frequency of micturition: Secondary | ICD-10-CM

## 2014-11-16 DIAGNOSIS — G40909 Epilepsy, unspecified, not intractable, without status epilepticus: Secondary | ICD-10-CM

## 2014-11-16 DIAGNOSIS — E785 Hyperlipidemia, unspecified: Secondary | ICD-10-CM

## 2014-11-16 LAB — POCT URINALYSIS DIPSTICK
BILIRUBIN UA: NEGATIVE
Blood, UA: NEGATIVE
Glucose, UA: NEGATIVE
Ketones, UA: NEGATIVE
LEUKOCYTES UA: NEGATIVE
Nitrite, UA: NEGATIVE
PH UA: 5.5
PROTEIN UA: NEGATIVE
Spec Grav, UA: 1.01
Urobilinogen, UA: 0.2

## 2014-11-16 LAB — COMPREHENSIVE METABOLIC PANEL
ALT: 17 U/L (ref 0–53)
AST: 19 U/L (ref 0–37)
Albumin: 5.1 g/dL (ref 3.5–5.2)
Alkaline Phosphatase: 54 U/L (ref 39–117)
BILIRUBIN TOTAL: 0.8 mg/dL (ref 0.2–1.2)
BUN: 15 mg/dL (ref 6–23)
CO2: 27 mEq/L (ref 19–32)
CREATININE: 0.85 mg/dL (ref 0.50–1.35)
Calcium: 9.9 mg/dL (ref 8.4–10.5)
Chloride: 104 mEq/L (ref 96–112)
Glucose, Bld: 110 mg/dL — ABNORMAL HIGH (ref 70–99)
Potassium: 5 mEq/L (ref 3.5–5.3)
Sodium: 140 mEq/L (ref 135–145)
Total Protein: 6.8 g/dL (ref 6.0–8.3)

## 2014-11-16 LAB — LIPID PANEL
CHOL/HDL RATIO: 3.8 ratio
Cholesterol: 170 mg/dL (ref 0–200)
HDL: 45 mg/dL (ref >39)
LDL CALC: 107 mg/dL — AB (ref 0–99)
Triglycerides: 91 mg/dL (ref <150)
VLDL: 18 mg/dL (ref 0–40)

## 2014-11-16 LAB — POCT UA - MICROSCOPIC ONLY
Bacteria, U Microscopic: NEGATIVE
CASTS, UR, LPF, POC: NEGATIVE
Crystals, Ur, HPF, POC: NEGATIVE
Epithelial cells, urine per micros: NEGATIVE
MUCUS UA: NEGATIVE
RBC, urine, microscopic: NEGATIVE
WBC, Ur, HPF, POC: NEGATIVE
Yeast, UA: NEGATIVE

## 2014-11-16 LAB — POCT CBC
GRANULOCYTE PERCENT: 61.3 % (ref 37–80)
HCT, POC: 47.9 % (ref 43.5–53.7)
Hemoglobin: 15.5 g/dL (ref 14.1–18.1)
Lymph, poc: 1.7 (ref 0.6–3.4)
MCH: 29.6 pg (ref 27–31.2)
MCHC: 32.4 g/dL (ref 31.8–35.4)
MCV: 91.4 fL (ref 80–97)
MID (CBC): 0.3 (ref 0–0.9)
MPV: 7.6 fL (ref 0–99.8)
PLATELET COUNT, POC: 170 10*3/uL (ref 142–424)
POC Granulocyte: 3.2 (ref 2–6.9)
POC LYMPH %: 32.7 % (ref 10–50)
POC MID %: 6 %M (ref 0–12)
RBC: 5.24 M/uL (ref 4.69–6.13)
RDW, POC: 12.8 %
WBC: 5.3 10*3/uL (ref 4.6–10.2)

## 2014-11-16 LAB — GLUCOSE, POCT (MANUAL RESULT ENTRY): POC Glucose: 102 mg/dl — AB (ref 70–99)

## 2014-11-16 LAB — POCT GLYCOSYLATED HEMOGLOBIN (HGB A1C): Hemoglobin A1C: 5.3

## 2014-11-16 MED ORDER — PRAVASTATIN SODIUM 20 MG PO TABS: 20.0000 mg | ORAL_TABLET | Freq: Every day | ORAL | Status: DC

## 2014-11-16 MED ORDER — LAMOTRIGINE 200 MG PO TABS: 200.0000 mg | ORAL_TABLET | Freq: Every day | ORAL | Status: DC

## 2014-11-16 NOTE — Patient Instructions (Signed)
I will contact you with your lab results as soon as they are available.   If you have not heard from me in 2 weeks, please contact me.  The fastest way to get your results is to register for My Chart (see the instructions on the last page of this printout).  Things that often make reflux symptoms worse: Caffeine Carbonation (soda) Spicy foods Acidic foods (like tomato sauce, orange juice, lemonade) Fatty foods (including whole milk and ice cream) Stress (feeling sad, worried, nervous) Nicotine Alcohol  To help reduce constipation and promote bowel health, 1. Drink at least 64 ounces of water each day; 2. Eat plenty of fiber (fruits, vegetables, whole grains, legumes) 3. Get plenty of physical activity  If needed, use a stool softener (docusate) or an osmotic laxative (like Miralax) each day, or as needed.

## 2014-11-16 NOTE — Progress Notes (Signed)
Subjective:    Patient ID: Brett [name], male    DOB: June 01, 1976, 38 y.o.   MRN: [id number]   PCP: Dois DavenportICHTER,KAREN L., MD  Chief Complaint  Patient presents with  . Medication Refill    Lamictal; Pravachol    No Known Allergies  Patient Active Problem List   Diagnosis Date Noted  . Seizure disorder 09/27/2012  . Hyperlipidemia 09/27/2012  . Mood disorder 09/27/2012    Prior to Admission medications   Medication Sig Start Date End Date Taking? Authorizing Provider  lamoTRIgine (LAMICTAL) 200 MG tablet Take 1 tablet (200 mg total) by mouth daily. NO MORE REFILLS WITHOUT OFFICE VISIT - 2ND NOTICE 10/09/14  Yes Lecretia Buczek S Johncarlo Maalouf, PA-C  pravastatin (PRAVACHOL) 20 MG tablet Take 1 tablet (20 mg total) by mouth daily. 09/28/12  Yes Fernande Brashelle S Asser Lucena, PA-C    Medical, Surgical, Family and Social History reviewed and updated.  HPI  Presents for follow-up of hyperlipidemia and needs refills of Lamictal and Pravachol. Last seen 10/2013. No seizure activity. In general, he feels good.  He intermittently has epigastric abdominal pain, burning, especially after eating food high in acidity.  He also notes frequent urination for the past month, better now than initially, when it was "Every 10 minutes." He is sexually active with only his wife. No penile discharge. No burning with urination or hematuria. Frequent hard stools.  Review of Systems As above. Otherwise negative.    Objective:   Physical Exam  Constitutional: He is oriented to person, place, and time. He appears well-developed and well-nourished. He is active and cooperative. No distress.  BP 118/70 mmHg  Pulse 69  Temp(Src) 98.1 F (36.7 C) (Oral)  Resp 18  Ht 5' 6.5" (1.689 m)  Wt 169 lb 9.6 oz (76.93 kg)  BMI 26.97 kg/m2  SpO2 98%   HENT:  Head: Normocephalic and atraumatic.  Right Ear: External ear normal.  Left Ear: External ear normal.  Nose: Nose normal.  Mouth/Throat: Oropharynx is clear and moist.  Eyes:  Conjunctivae and EOM are normal. Pupils are equal, round, and reactive to light. No scleral icterus.  Neck: No thyromegaly present.  Cardiovascular: Normal rate, regular rhythm and normal heart sounds.   Pulmonary/Chest: Effort normal and breath sounds normal.  Lymphadenopathy:    He has no cervical adenopathy.  Neurological: He is alert and oriented to person, place, and time.  Skin: Skin is warm and dry.  Psychiatric: He has a normal mood and affect. His speech is normal and behavior is normal.      Results for orders placed or performed in visit on 11/16/14  POCT CBC  Result Value Ref Range   WBC 5.3 4.6 - 10.2 K/uL   Lymph, poc 1.7 0.6 - 3.4   POC LYMPH PERCENT 32.7 10 - 50 %L   MID (cbc) 0.3 0 - 0.9   POC MID % 6.0 0 - 12 %M   POC Granulocyte 3.2 2 - 6.9   Granulocyte percent 61.3 37 - 80 %G   RBC 5.24 4.69 - 6.13 M/uL   Hemoglobin 15.5 14.1 - 18.1 g/dL   HCT, POC 40.947.9 81.143.5 - 53.7 %   MCV 91.4 80 - 97 fL   MCH, POC 29.6 27 - 31.2 pg   MCHC 32.4 31.8 - 35.4 g/dL   RDW, POC 91.412.8 %   Platelet Count, POC 170 142 - 424 K/uL   MPV 7.6 0 - 99.8 fL  POCT glucose (manual entry)  Result Value  Ref Range   POC Glucose 102 (A) 70 - 99 mg/dl  POCT glycosylated hemoglobin (Hb A1C)  Result Value Ref Range   Hemoglobin A1C 5.3   POCT UA - Microscopic Only  Result Value Ref Range   WBC, Ur, HPF, POC neg    RBC, urine, microscopic neg    Bacteria, U Microscopic neg    Mucus, UA neg    Epithelial cells, urine per micros neg    Crystals, Ur, HPF, POC neg    Casts, Ur, LPF, POC neg    Yeast, UA neg   POCT urinalysis dipstick  Result Value Ref Range   Color, UA yellow    Clarity, UA clear    Glucose, UA neg    Bilirubin, UA neg    Ketones, UA neg    Spec Grav, UA 1.010    Blood, UA neg    pH, UA 5.5    Protein, UA neg    Urobilinogen, UA 0.2    Nitrite, UA neg    Leukocytes, UA Negative        Assessment & Plan:  1. Hyperlipidemia Await lab results. Encouraged healthy  eating and regular exercise. - Comprehensive metabolic panel - Lipid panel - pravastatin (PRAVACHOL) 20 MG tablet; Take 1 tablet (20 mg total) by mouth daily.  Dispense: 90 tablet; Refill: 3  2. Seizure disorder Controlled on current medication dose. - POCT CBC - Lamotrigine level - lamoTRIgine (LAMICTAL) 200 MG tablet; Take 1 tablet (200 mg total) by mouth daily.  Dispense: 90 tablet; Refill: 3  3. Hyperglycemia Non-fasting glucose today is 102. A1C is normal. - POCT glucose (manual entry) - POCT glycosylated hemoglobin (Hb A1C)  4. Urinary frequency Possibly related to constipation. If symptoms persist, recommend STI screening. - POCT UA - Microscopic Only - POCT urinalysis dipstick   Fernande Brashelle S. Jesiah Grismer, PA-C Physician Assistant-Certified [facility]

## 2014-11-19 ENCOUNTER — Encounter: Payer: Self-pay | Admitting: Physician Assistant

## 2014-11-19 ENCOUNTER — Other Ambulatory Visit: Payer: Self-pay | Admitting: Physician Assistant

## 2014-11-19 LAB — LAMOTRIGINE LEVEL: Lamotrigine Lvl: 3.7 ug/mL — ABNORMAL LOW (ref 4.0–18.0)

## 2014-11-20 ENCOUNTER — Other Ambulatory Visit: Payer: Self-pay

## 2014-11-20 NOTE — Telephone Encounter (Signed)
Duplicate

## 2014-11-22 ENCOUNTER — Other Ambulatory Visit: Payer: Self-pay | Admitting: Physician Assistant

## 2014-11-27 ENCOUNTER — Other Ambulatory Visit: Payer: Self-pay | Admitting: Physician Assistant

## 2015-11-17 ENCOUNTER — Other Ambulatory Visit: Payer: Self-pay | Admitting: Physician Assistant

## 2015-12-17 ENCOUNTER — Other Ambulatory Visit: Payer: Self-pay | Admitting: Physician Assistant

## 2016-01-10 ENCOUNTER — Other Ambulatory Visit: Payer: Self-pay | Admitting: Physician Assistant

## 2016-02-10 ENCOUNTER — Telehealth: Payer: Self-pay | Admitting: Physician Assistant

## 2016-02-11 ENCOUNTER — Ambulatory Visit (INDEPENDENT_AMBULATORY_CARE_PROVIDER_SITE_OTHER): Payer: BC Managed Care – PPO | Admitting: Physician Assistant

## 2016-02-11 ENCOUNTER — Encounter: Payer: Self-pay | Admitting: Physician Assistant

## 2016-02-11 VITALS — BP 124/76 | HR 72 | Temp 98.1°F | Resp 16 | Ht 66.5 in | Wt 168.4 lb

## 2016-02-11 DIAGNOSIS — Z23 Encounter for immunization: Secondary | ICD-10-CM

## 2016-02-11 DIAGNOSIS — Z114 Encounter for screening for human immunodeficiency virus [HIV]: Secondary | ICD-10-CM | POA: Diagnosis not present

## 2016-02-11 DIAGNOSIS — Z131 Encounter for screening for diabetes mellitus: Secondary | ICD-10-CM | POA: Diagnosis not present

## 2016-02-11 DIAGNOSIS — G40909 Epilepsy, unspecified, not intractable, without status epilepticus: Secondary | ICD-10-CM | POA: Diagnosis not present

## 2016-02-11 DIAGNOSIS — E785 Hyperlipidemia, unspecified: Secondary | ICD-10-CM

## 2016-02-11 MED ORDER — LAMOTRIGINE 200 MG PO TABS: ORAL_TABLET | ORAL | Status: DC

## 2016-02-11 MED ORDER — PRAVASTATIN SODIUM 20 MG PO TABS: 20.0000 mg | ORAL_TABLET | Freq: Every day | ORAL | Status: DC

## 2016-02-11 NOTE — Progress Notes (Signed)
Patient ID: [name], male    DOB: 1976-06-28, 40 y.o.   MRN: [id number]  PCP: Carsyn Taubman, PA-C  Subjective:   Chief Complaint  Patient presents with  . Medication Refill    Lamictal 200 mg, Pravastatin 20 mg, pt is not fasting    HPI Brett [name] is a 40 year old hispanic male with a PMH of seizure disorder and hyperlipidemia who presents today for a medication refill. He missed his last visit due to travel to [place].  He states he has been doing well over the past year. His weight has remained stable, he tries to exercise 30min 3x/week. 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 He does not necessarily watch what he eats, but he eats a balance of nutrients including vegetables and rice.   Last visit he c/o pyrosis and indigestion. Since then he has decreased his intake of spicy foods and soda which has significantly helped his GERD symptoms. Patient does not desire to be on medication at this time since lifestyle modifications have helped.  He endorses having no seizures in the past year, tolerating his medication well with no rashes or neurologic deficits noted. Denies blurry vision, additional vision changes, headaches or dizziness.   Review of Systems Constitutional: Negative for activity change, appetite change and fatigue.  Respiratory: Negative for cough, chest tightness and shortness of breath.  Cardiovascular: Negative for chest pain and palpitations.  Gastrointestinal: Negative for nausea, vomiting, diarrhea and constipation.  Endocrine: Negative for polydipsia, polyphagia and polyuria.  Neurological: Negative for dizziness, seizures, weakness and light-headedness.     Patient Active Problem List   Diagnosis Date Noted  . Seizure disorder (HCC) 09/27/2012  . Hyperlipidemia 09/27/2012  . Mood disorder (HCC) 09/27/2012     Prior to Admission medications   Medication Sig Start Date End Date Taking? Authorizing Provider  lamoTRIgine (LAMICTAL) 200  MG tablet TAKE 1 TABLET BY MOUTH EVERY DAY 12/17/15  Yes Sheletha Bow, PA-C  pravastatin (PRAVACHOL) 20 MG tablet TAKE 1 TABLET BY MOUTH EVERY DAY. PT NEEDS OFFICE VISIT/FASTING LABS FOR ADDITIONAL REFILLS 01/10/16  Yes Manjot Hinks, PA-C     No Known Allergies     Objective:  Physical Exam  Constitutional: He is oriented to person, place, and time. He appears well-developed and well-nourished. He is active and cooperative. No distress.  BP 124/76 mmHg  Pulse 72  Temp(Src) 98.1 F (36.7 C) (Oral)  Resp 16  Ht 5' 6.5" (1.689 m)  Wt 168 lb 6.4 oz (76.386 kg)  BMI 26.78 kg/m2  SpO2 96%  HENT:  Head: Normocephalic and atraumatic.  Right Ear: Hearing normal.  Left Ear: Hearing normal.  Eyes: Conjunctivae are normal. No scleral icterus.  Neck: Normal range of motion. Neck supple. No thyromegaly present.  Cardiovascular: Normal rate, regular rhythm and normal heart sounds.   Pulses:      Radial pulses are 2+ on the right side, and 2+ on the left side.  Pulmonary/Chest: Effort normal and breath sounds normal.  Lymphadenopathy:       Head (right side): No tonsillar, no preauricular, no posterior auricular and no occipital adenopathy present.       Head (left side): No tonsillar, no preauricular, no posterior auricular and no occipital adenopathy present.    He has no cervical adenopathy.       Right: No supraclavicular adenopathy present.       Left: No supraclavicular adenopathy present.  Neurological: He is alert and oriented  to person, place, and time. No sensory deficit.  Skin: Skin is warm, dry and intact. No rash noted. No cyanosis or erythema. Nails show no clubbing.  Psychiatric: He has a normal mood and affect. His speech is normal and behavior is normal.           Assessment & Plan:   1. Seizure disorder (HCC) Stable. No seizure in the past 12 months. Update labs. Continue current treatment. - Lamotrigine level - Comprehensive metabolic panel - lamoTRIgine  (LAMICTAL) 200 MG tablet; TAKE 1 TABLET BY MOUTH EVERY DAY  Dispense: 90 tablet; Refill: 3  2. Hyperlipidemia Await lab results. Adjust treatment if indicated. - Lipid panel - Comprehensive metabolic panel - pravastatin (PRAVACHOL) 20 MG tablet; Take 1 tablet (20 mg total) by mouth daily.  Dispense: 90 tablet; Refill: 3  3. Screening for diabetes mellitus (DM) Await A1C. Consider metformin if elevated. - Hemoglobin A1C  4. Need for prophylactic vaccination and inoculation against influenza - Flu Vaccine QUAD 36+ mos IM  5. Screening for HIV (human immunodeficiency virus) - HIV antibody   Fernande Bras, PA-C Physician Assistant-Certified [facility]

## 2016-02-11 NOTE — Progress Notes (Signed)
Subjective:     Patient ID: Brett PlanJose A [name], male   DOB: [birth year], 40 y.o.   MRN: [id number]  HPI Brett [name] is a 40 year old hispanic male with a PMH of seizure disorder and hyperlipidemia who presents today for a medication refill. He states he has been doing well over the past year. His weight has remained stable, he tries to exercise 30min 3x/week. 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 He does not necessarily watch what he eats, but he eats a balance of nutrients including vegetables and rice.   Last visit he c/o pyrosis and indigestion. Since then he has decreased his intake of spicy foods and soda which has significantly helped his GERD symptoms. Patient does not desire to be on medication at this time since lifestyle modifications have helped.  He endorses having no seizures in the past year, tolerating his medication well with no rashes or neurologic deficits noted. Denies blurry vision, additional vision changes, headaches or dizziness.   Review of Systems  Constitutional: Negative for activity change, appetite change and fatigue.  Respiratory: Negative for cough, chest tightness and shortness of breath.   Cardiovascular: Negative for chest pain and palpitations.  Gastrointestinal: Negative for nausea, vomiting, diarrhea and constipation.  Endocrine: Negative for polydipsia, polyphagia and polyuria.  Neurological: Negative for dizziness, seizures, weakness and light-headedness.       Objective:   Physical Exam  Constitutional: He appears well-developed and well-nourished. No distress.  HENT:  Nose: Nose normal.  Mouth/Throat: Oropharynx is clear and moist. No oropharyngeal exudate.  Neck: Neck supple. No thyromegaly present.  Cardiovascular: Normal rate, regular rhythm, normal heart sounds and intact distal pulses.  Exam reveals no gallop and no friction rub.   No murmur heard. Pulmonary/Chest: Breath sounds normal. No respiratory distress. He has no wheezes. He  has no rales. He exhibits no tenderness.  Abdominal: Soft. Bowel sounds are normal. He exhibits no distension. There is no tenderness.  Lymphadenopathy:    He has no cervical adenopathy.       Assessment:     Brett [name] is a 40 year old hispanic male with a PMH of seizure disorder and hyperlipidemia. He has had no seizures in the past year and remains stable on his lamotrigine, will collect lamotrigine blood levels to check for therapeutic dosing and will continue him on his current regimen. The patient is non-fasting today, will collect lipid panel and consider pravastatin adjustment pending blood results. He also has a strong family history of DM and is an at risk hispanic population, will therefore check Hgb A1C today. Health maintainenance includes one time screening for HIV and influenza vaccination today. Follow up in 6 months.    [name]:     1. Seizure disorder (HCC) - Lamotrigine level - Comprehensive metabolic panel - lamoTRIgine (LAMICTAL) 200 MG tablet; TAKE 1 TABLET BY MOUTH EVERY DAY  Dispense: 90 tablet; Refill: 3  2. Hyperlipidemia - Lipid panel (non-fasting) - Comprehensive metabolic panel - pravastatin (PRAVACHOL) 20 MG tablet; Take 1 tablet (20 mg total) by mouth daily.  Dispense: 90 tablet; Refill: 3 - Consider changes in statin therapy pending lab results  3. Screening for diabetes mellitus (DM) - Hemoglobin A1C screening - Strong family history indicated for screening today  4. Need for prophylactic vaccination and inoculation against influenza - Flu Vaccine QUAD 36+ mos IM  5. Screening for HIV (human immunodeficiency virus) - HIV antibody

## 2016-02-12 LAB — LIPID PANEL
CHOL/HDL RATIO: 4.7 ratio (ref <=5.0)
CHOLESTEROL: 189 mg/dL (ref 125–200)
HDL: 40 mg/dL (ref >=40)
LDL CALC: 133 mg/dL — AB (ref <130)
TRIGLYCERIDES: 79 mg/dL (ref <150)
VLDL: 16 mg/dL (ref <30)

## 2016-02-12 LAB — COMPREHENSIVE METABOLIC PANEL
ALBUMIN: 4.9 g/dL (ref 3.6–5.1)
ALT: 22 U/L (ref 9–46)
AST: 21 U/L (ref 10–40)
Alkaline Phosphatase: 58 U/L (ref 40–115)
BILIRUBIN TOTAL: 0.6 mg/dL (ref 0.2–1.2)
BUN: 17 mg/dL (ref 7–25)
CALCIUM: 10.2 mg/dL (ref 8.6–10.3)
CHLORIDE: 102 mmol/L (ref 98–110)
CO2: 27 mmol/L (ref 20–31)
CREATININE: 0.82 mg/dL (ref 0.60–1.35)
Glucose, Bld: 94 mg/dL (ref 65–99)
POTASSIUM: 4.2 mmol/L (ref 3.5–5.3)
Sodium: 142 mmol/L (ref 135–146)
TOTAL PROTEIN: 7.3 g/dL (ref 6.1–8.1)

## 2016-02-12 LAB — HIV ANTIBODY (ROUTINE TESTING W REFLEX): HIV: NONREACTIVE

## 2016-02-13 LAB — HEMOGLOBIN A1C
Hgb A1c MFr Bld: 5.9 % — ABNORMAL HIGH (ref <5.7)
Mean Plasma Glucose: 123 mg/dL — ABNORMAL HIGH (ref <117)

## 2016-02-15 ENCOUNTER — Encounter: Payer: Self-pay | Admitting: Physician Assistant

## 2016-02-15 LAB — LAMOTRIGINE LEVEL: Lamotrigine Lvl: 1.7 ug/mL — ABNORMAL LOW (ref 4.0–18.0)

## 2016-02-15 MED ORDER — LAMOTRIGINE 100 MG PO TABS: 100.0000 mg | ORAL_TABLET | Freq: Every day | ORAL | Status: DC

## 2016-02-15 NOTE — Addendum Note (Signed)
Addended by: Fernande BrasJEFFERY, Shammond Arave S on: 02/15/2016 05:34 PM   Modules accepted: Orders, Medications

## 2016-02-17 NOTE — Telephone Encounter (Signed)
Spoke to pt. I reviewed pt's chart to figure out why dose was changed. Chelle did lab work on pt that showed Brett [name] is probably not getting any benefit from the Lamictal. She decided to reduce his dose to 100mg ; in 3 months we will recheck levels and see how patient is doing.  Transferred pt to scheduling to set up an appointment in 3 months with Chelle.

## 2016-02-17 NOTE — Telephone Encounter (Addendum)
Pt states Brett [name] usually give him 200 MG of his LAMICTAL, but this time she gave him 100 MG instead and he would like to know why Please call [phone]    [facility] [address]

## 2016-05-19 ENCOUNTER — Ambulatory Visit (INDEPENDENT_AMBULATORY_CARE_PROVIDER_SITE_OTHER): Payer: BC Managed Care – PPO | Admitting: Physician Assistant

## 2016-05-19 ENCOUNTER — Encounter: Payer: Self-pay | Admitting: Physician Assistant

## 2016-05-19 VITALS — BP 130/78 | HR 64 | Temp 98.1°F | Resp 16 | Ht 66.5 in | Wt 169.2 lb

## 2016-05-19 DIAGNOSIS — F39 Unspecified mood [affective] disorder: Secondary | ICD-10-CM

## 2016-05-19 DIAGNOSIS — G40909 Epilepsy, unspecified, not intractable, without status epilepticus: Secondary | ICD-10-CM

## 2016-05-19 DIAGNOSIS — R7309 Other abnormal glucose: Secondary | ICD-10-CM

## 2016-05-19 DIAGNOSIS — E785 Hyperlipidemia, unspecified: Secondary | ICD-10-CM | POA: Diagnosis not present

## 2016-05-19 LAB — HEMOGLOBIN A1C
HEMOGLOBIN A1C: 5.9 % — AB (ref <5.7)
Mean Plasma Glucose: 123 mg/dL

## 2016-05-19 NOTE — Patient Instructions (Addendum)
  Recommend follow up with Neurology to evaluate seizure disorder and whether or not you still need to be on Lamictal.   IF you received an x-ray today, you will receive an invoice from [facility]. Please contact [facility] at [phone] with questions or concerns regarding your invoice.   IF you received labwork today, you will receive an invoice from [facility]. Please contact [facility] at [phone] with questions or concerns regarding your invoice.   Our billing staff will not be able to assist you with questions regarding bills from these companies.  You will be contacted with the lab results as soon as they are available. The fastest way to get your results is to activate your My Chart account. Instructions are located on the last page of this paperwork. If you have not heard from us regarding the results in 2 weeks, please contact this office.

## 2016-05-19 NOTE — Progress Notes (Signed)
   Subjective:    Patient ID: Brett [name], male    DOB: 02-26-1976, 40 y.o.   MRN: [id number]  HPI  Patient presents today for follow up of his seizure disorder and change in medication.   Denies any seizures in last 10-12 years.  Compliant with medication and tolerating new dosage of well. States he eating well, sleeps well and has experienced no change in mood.  Admits to fatigue but associates it with his long hours work hours. Exercises very little but states his physcially active at his jobs all day.  Denies change in BM or urination, diarrhea, constipation, nausea, chest pain, and SOB.  Review of Systems All others negative except those listed in HPI.  Patient Active Problem List   Diagnosis Date Noted  . Seizure disorder (HCC) 09/27/2012  . Hyperlipidemia 09/27/2012  . Mood disorder (HCC) 09/27/2012   Current Outpatient Prescriptions on File Prior to Visit  Medication Sig Dispense Refill  . lamoTRIgine (LAMICTAL) 100 MG tablet Take 1 tablet (100 mg total) by mouth daily. 90 tablet 3  . pravastatin (PRAVACHOL) 20 MG tablet Take 1 tablet (20 mg total) by mouth daily. 90 tablet 3   No current facility-administered medications on file prior to visit.   No Known Allergies       Objective: BP 130/78 mmHg  Pulse 64  Temp(Src) 98.1 F (36.7 C) (Oral)  Resp 16  Ht 5' 6.5" (1.689 m)  Wt 169 lb 3.2 oz (76.749 kg)  BMI 26.90 kg/m2  SpO2 97%   Physical Exam  Constitutional: He is oriented to person, place, and time. He appears well-developed and well-nourished.  Cardiovascular: Normal rate, regular rhythm, normal heart sounds and intact distal pulses.   Pulmonary/Chest: Effort normal and breath sounds normal.  Neurological: He is alert and oriented to person, place, and time.  Skin: Skin is warm and dry. No rash noted.  Psychiatric: He has a normal mood and affect. His behavior is normal. Judgment and thought content normal.      Assessment & Plan:  1. Seizure  disorder (HCC) Stable, patient has not had a seizure in over 12 years. Patient is tolerating lower dosage of Lamictal well.  Checking blood levels, labs pending.   Discussed with patient referral to Neurology for re-evaluation of seizure disorder to see if patient need to remain on medication.  If Neurology indicates medication is not needed, will consider continuing low dosage to help with mood disorder. - Lamotrigine level - Ambulatory referral to Neurology  2. Hyperlipidemia Labs colleted, results pending.  Last Lipid panel indicated elevated LDL.  Currently on pravastatin, assessing efficacy and if further evaluation and management required.   - Lipid panel - Comprehensive metabolic panel  3. Elevated hemoglobin A1c Labs collected and pending.  Previously elevated, will consider diabetic management if still high. - Hemoglobin A1c  Patient instructed to follow up in 6 months.  Will contact with lab results and address any abnormalities noted.  Trinaty Bundrick P. Deshundra Waller, PA-S

## 2016-05-19 NOTE — Progress Notes (Signed)
Patient ID: Brett [name], male    DOB: 13-May-1976, 40 y.o.   MRN: [id number]  PCP: Katurah Karapetian, PA-C  Subjective:   Chief Complaint  Patient presents with  . Follow-up    3 MONTHS MEDICATION    HPI Presents for evaluation of seizure disorder, hyperlipidemia and elevated A1C.  He continues to do well, without problems or concerns. No adverse effects from medications. At his last visit, the lamotrigine level was subtherapeutic, and we reduced the dose, suspecting that he may not need the product anymore. Last seizure was 10-12 years ago.  He has not followed up on recommendations to see neurology.  He has a history of mood disorder, and it is possible that the lamotrigine is also for that. He recalls feeling irritable and angry when he ran out of it one time previously, but isn't able to further describe the mood problem. He has a history of depression. He does recall being treated with dilantin prior to switching to lamotrigine.    Review of Systems As above. No chest pain, SOB, HA, dizziness, vision change, N/V, diarrhea, constipation, dysuria, urinary urgency or frequency, myalgias, arthralgias or rash.     Patient Active Problem List   Diagnosis Date Noted  . Seizure disorder (HCC) 09/27/2012  . Hyperlipidemia 09/27/2012  . Mood disorder (HCC) 09/27/2012     Prior to Admission medications   Medication Sig Start Date End Date Taking? Authorizing Provider  lamoTRIgine (LAMICTAL) 100 MG tablet Take 1 tablet (100 mg total) by mouth daily. 02/15/16  Yes Laramie Meissner, PA-C  pravastatin (PRAVACHOL) 20 MG tablet Take 1 tablet (20 mg total) by mouth daily. 02/11/16  Yes Azya Barbero, PA-C     No Known Allergies     Objective:  Physical Exam  Constitutional: He is oriented to person, place, and time. He appears well-developed and well-nourished. He is active and cooperative. No distress.  BP 130/78 mmHg  Pulse 64  Temp(Src) 98.1 F (36.7 C) (Oral)  Resp 16  Ht 5'  6.5" (1.689 m)  Wt 169 lb 3.2 oz (76.749 kg)  BMI 26.90 kg/m2  SpO2 97%  HENT:  Head: Normocephalic and atraumatic.  Right Ear: Hearing normal.  Left Ear: Hearing normal.  Eyes: Conjunctivae are normal. No scleral icterus.  Neck: Normal range of motion. Neck supple. No thyromegaly present.  Cardiovascular: Normal rate, regular rhythm and normal heart sounds.   Pulses:      Radial pulses are 2+ on the right side, and 2+ on the left side.  Pulmonary/Chest: Effort normal and breath sounds normal.  Lymphadenopathy:       Head (right side): No tonsillar, no preauricular, no posterior auricular and no occipital adenopathy present.       Head (left side): No tonsillar, no preauricular, no posterior auricular and no occipital adenopathy present.    He has no cervical adenopathy.       Right: No supraclavicular adenopathy present.       Left: No supraclavicular adenopathy present.  Neurological: He is alert and oriented to person, place, and time. No sensory deficit.  Skin: Skin is warm, dry and intact. No rash noted. No cyanosis or erythema. Nails show no clubbing.  Psychiatric: He has a normal mood and affect. His speech is normal and behavior is normal.           Assessment & Plan:   1. Seizure disorder (HCC) Update level again today. He now agrees to see neurology. I wonder if he could  come off the med now, though it may be worthwhile to continue it as a mood stabilizer. - Lamotrigine level - Ambulatory referral to Neurology  2. Hyperlipidemia Update labs. Adjust regimen if indicated. - Lipid panel - Comprehensive metabolic panel  3. Elevated hemoglobin A1c Await lab. Will initiate metformin if needed. - Hemoglobin A1c   Fernande Brashelle S. Bunny Lowdermilk, PA-C Physician Assistant-Certified [facility]

## 2016-05-20 LAB — COMPREHENSIVE METABOLIC PANEL
ALK PHOS: 49 U/L (ref 40–115)
ALT: 20 U/L (ref 9–46)
AST: 21 U/L (ref 10–40)
Albumin: 4.7 g/dL (ref 3.6–5.1)
BILIRUBIN TOTAL: 0.6 mg/dL (ref 0.2–1.2)
BUN: 16 mg/dL (ref 7–25)
CO2: 25 mmol/L (ref 20–31)
CREATININE: 0.82 mg/dL (ref 0.60–1.35)
Calcium: 9.6 mg/dL (ref 8.6–10.3)
Chloride: 102 mmol/L (ref 98–110)
GLUCOSE: 92 mg/dL (ref 65–99)
Potassium: 4.5 mmol/L (ref 3.5–5.3)
SODIUM: 138 mmol/L (ref 135–146)
Total Protein: 6.7 g/dL (ref 6.1–8.1)

## 2016-05-20 LAB — LIPID PANEL
CHOLESTEROL: 204 mg/dL — AB (ref 125–200)
HDL: 47 mg/dL (ref >=40)
LDL CALC: 134 mg/dL — AB (ref <130)
TRIGLYCERIDES: 114 mg/dL (ref <150)
Total CHOL/HDL Ratio: 4.3 Ratio (ref <=5.0)
VLDL: 23 mg/dL (ref <30)

## 2016-05-23 LAB — LAMOTRIGINE LEVEL: Lamotrigine Lvl: 1.7 ug/mL — ABNORMAL LOW (ref 4.0–18.0)

## 2016-05-24 ENCOUNTER — Encounter: Payer: Self-pay | Admitting: Physician Assistant

## 2016-06-17 ENCOUNTER — Encounter: Payer: Self-pay | Admitting: Diagnostic Neuroimaging

## 2016-06-17 ENCOUNTER — Ambulatory Visit (INDEPENDENT_AMBULATORY_CARE_PROVIDER_SITE_OTHER): Payer: BC Managed Care – PPO | Admitting: Diagnostic Neuroimaging

## 2016-06-17 VITALS — BP 133/83 | HR 63 | Ht 68.0 in | Wt 172.2 lb

## 2016-06-17 DIAGNOSIS — G40909 Epilepsy, unspecified, not intractable, without status epilepticus: Secondary | ICD-10-CM | POA: Diagnosis not present

## 2016-06-17 NOTE — Progress Notes (Signed)
[facility]  PATIENT: Brett [name] DOB: 12/28/1975  REFERRING CLINICIAN: Lacretia Leigh HISTORY FROM: patient  REASON FOR VISIT: new consult    HISTORICAL  CHIEF COMPLAINT:  Chief Complaint  Patient presents with  . Seizures    rm 7, New Pt, "hx seizures as a child, last one > 15 yrs ago; "dr thinks I don't need Lamictal. She wants you to decide."    HISTORY OF PRESENT ILLNESS:   40 year old right-handed male here for evaluation of seizure disorder. Age 64 years old patient was living in [place], playing marbles with friends, when all of a sudden he had tunnel vision, loss of consciousness and generalized convulsions. Patient bit his tongue. He was diagnosed with seizure disorder. Initially this was treated conservatively by his local community physicians with vitamins. Eventually he went to a city Dr. Johna Sheriff was diagnosed officially with seizure disorder and started on Dilantin. Patient was on therapy from age 17 until age 79 years old. By age 40 years old seizures had resolved. Age 42 years old patient moved to [place]. Patient had one more seizure at age 72 years old under significant stress from divorce/separation from his ex-wife. Around that time he was transitioned to lamotrigine. Since that time no further seizures.  Patient reports family history of seizure in his own father, who possibly had neurocysticercosis.   REVIEW OF SYSTEMS: Full 14 system review of systems performed and negative with exception of: Not asleep insomnia snoring moles.  ALLERGIES: No Known Allergies  HOME MEDICATIONS: Outpatient Prescriptions Prior to Visit  Medication Sig Dispense Refill  . lamoTRIgine (LAMICTAL) 100 MG tablet Take 1 tablet (100 mg total) by mouth daily. 90 tablet 3  . pravastatin (PRAVACHOL) 20 MG tablet Take 1 tablet (20 mg total) by mouth daily. 90 tablet 3   No facility-administered medications prior to visit.    PAST MEDICAL HISTORY: Past Medical  History  Diagnosis Date  . Hyperlipidemia   . Mood disorder (HCC)   . Seizures (HCC)     hx of seizures as child, last sz > 15 years ago    PAST SURGICAL HISTORY: Past Surgical History  Procedure Laterality Date  . Appendectomy  2003    FAMILY HISTORY: Family History  Problem Relation Age of Onset  . Diabetes Mother   . Heart disease Mother   . Mental illness Mother     depression  . Seizures Father     possibly related to neurocystercercosis    SOCIAL HISTORY:  Social History   Social History  . Marital Status: Married    Spouse Name: Laurelyn Sickle  . Number of Children: 3  . Years of Education: 4   Occupational History  . 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 Smoking status: Never Smoker   . Smokeless tobacco: Never Used  . Alcohol Use: 1.2 oz/week    2 Cans of beer per week     Comment: sometimes  . Drug Use: No  . Sexual Activity: Yes   Other Topics Concern  . Not on file   Social History Narrative   Lives with wife and the two daughters they share.  His son lives with the son's mother.   From [place], [place]; came to the [place] in 1995.   Works for [employer]   Caffeine use- coffee 1 cup daily     PHYSICAL EXAM   GENERAL EXAM/CONSTITUTIONAL: Vitals:  Filed Vitals:   06/17/16 1132  BP: 133/83  Pulse: 63  Height:  (1.727 m)  Weight: 172 lb 3.2 oz (78.109 kg)   Body mass index is 26.19 kg/(m^2).  Visual Acuity Screening   Right eye Left eye Both eyes  Without correction: 20/20 20/30   With correction:       Patient is in no distress; well developed, nourished and groomed; neck is supple  CARDIOVASCULAR:  Examination of carotid arteries is normal; no carotid bruits  Regular rate and rhythm, no murmurs  Examination of peripheral vascular system by observation and palpation is normal  EYES:  Ophthalmoscopic exam of optic discs and posterior segments is normal; no papilledema or  hemorrhages  MUSCULOSKELETAL:  Gait, strength, tone, movements noted in Neurologic exam below  NEUROLOGIC: MENTAL STATUS:  No flowsheet data found.  awake, alert, oriented to person, place and time  recent and remote memory intact  normal attention and concentration  language fluent, comprehension intact, naming intact,   fund of knowledge appropriate  CRANIAL NERVE:   2nd - no papilledema on fundoscopic exam  2nd, 3rd, 4th, 6th - pupils equal and reactive to light, visual fields full to confrontation, extraocular muscles intact, no nystagmus  5th - facial sensation symmetric  7th - facial strength symmetric  8th - hearing intact  9th - palate elevates symmetrically, uvula midline  11th - shoulder shrug symmetric  12th - tongue protrusion midline  MOTOR:   normal bulk and tone, full strength in the BUE, BLE  SENSORY:   normal and symmetric to light touch, temperature, vibration  COORDINATION:   finger-nose-finger, fine finger movements normal  REFLEXES:   deep tendon reflexes present and symmetric  GAIT/STATION:   narrow based gait; able to walk tandem; romberg is negative    DIAGNOSTIC DATA (LABS, IMAGING, TESTING) - I reviewed patient records, labs, notes, testing and imaging myself where available.  Lab Results  Component Value Date   WBC 5.3 11/16/2014   HGB 15.5 11/16/2014   HCT 47.9 11/16/2014   MCV 91.4 11/16/2014      Component Value Date/Time   NA 138 05/19/2016 1642   K 4.5 05/19/2016 1642   CL 102 05/19/2016 1642   CO2 25 05/19/2016 1642   GLUCOSE 92 05/19/2016 1642   BUN 16 05/19/2016 1642   CREATININE 0.82 05/19/2016 1642   CALCIUM 9.6 05/19/2016 1642   PROT 6.7 05/19/2016 1642   ALBUMIN 4.7 05/19/2016 1642   AST 21 05/19/2016 1642   ALT 20 05/19/2016 1642   ALKPHOS 49 05/19/2016 1642   BILITOT 0.6 05/19/2016 1642   Lab Results  Component Value Date   CHOL 204* 05/19/2016   HDL 47 05/19/2016   LDLCALC 134*  05/19/2016   TRIG 114 05/19/2016   CHOLHDL 4.3 05/19/2016   Lab Results  Component Value Date   HGBA1C 5.9* 05/19/2016   No results found for: VITAMINB12 No results found for: TSH     ASSESSMENT AND PLAN  40 y.o. year old male here with history of seizure disorder, since age 80 years old, last seizure at age 54 years old. Patient doing well on lamotrigine. Has family history of seizure disorder in his father, possibly related to neurocysticercosis. Patient is at some risk for same condition due to his history of living in [place].   Dx:   1. Seizure disorder (HCC)      PLAN: - check MRI and EEG; if normal, then may consider tapering lamotrigine off; however, this would require short term driving restriction (~  3 months) to ensure safety; if any scar tissue or abnl EEG, then will stay on lamotrigine long term - continue lamotrigine for now  Orders Placed This Encounter  Procedures  . MR Brain W Wo Contrast  . EEG adult   Return in about 6 weeks (around 07/29/2016).     Suanne MarkerVIKRAM R. PENUMALLI, MD 06/17/2016, 12:03 PM Certified in Neurology, Neurophysiology and Neuroimaging  [facility] [address], [address] [place], [place] [address] [phone]

## 2016-06-17 NOTE — Patient Instructions (Signed)
Thank you for coming to see [place] at [facility]. I hope we have been able to provide you high quality care today.  You may receive a patient satisfaction survey over the next few weeks. We would appreciate your feedback and comments so that we may continue to improve ourselves and the health of our patients.  - I will check MRI brain and EEG - continue lamotrigine 176m daily   ~~~~~~~~~~~~~~~~~~~~~~~~~~~~~~~~~~~~~~~~~~~~~~~~~~~~~~~~~~~~~~~~~  DR. Myca Perno'S GUIDE TO HAPPY AND HEALTHY LIVING These are some of my general health and wellness recommendations. Some of them may apply to you better than others. Please use common sense as you try these suggestions and feel free to ask me any questions.   ACTIVITY/FITNESS Mental, social, emotional and physical stimulation are very important for brain and body health. Try learning a new activity (arts, music, language, sports, games).  Keep moving your body to the best of your abilities. You can do this at home, inside or outside, the park, community center, gym or anywhere you like. Consider a physical therapist or personal trainer to get started. Consider the app Sworkit. Fitness trackers such as smart-watches, smart-phones or Fitbits can help as well.   NUTRITION Eat more plants: colorful vegetables, nuts, seeds and berries.  Eat less sugar, salt, preservatives and processed foods.  Avoid toxins such as cigarettes and alcohol.  Drink water when you are thirsty. Warm water with a slice of lemon is an excellent morning drink to start the day.  Consider these websites for more information The Nutrition Source (hhttps://www.henry-hernandez.biz/ Precision Nutrition (wWindowBlog.ch   RELAXATION Consider practicing mindfulness meditation or other relaxation techniques such as deep breathing, prayer, yoga, tai chi, massage. See website mindful.org or the apps Headspace or Calm to help  get started.   SLEEP Try to get at least 7-8+ hours sleep per day. Regular exercise and reduced caffeine will help you sleep better. Practice good sleep hygeine techniques. See website sleep.org for more information.   PLANNING Prepare estate planning, living will, healthcare POA documents. Sometimes this is best planned with the help of an attorney. Theconversationproject.org and agingwithdignity.org are excellent resources.

## 2016-06-29 ENCOUNTER — Ambulatory Visit (INDEPENDENT_AMBULATORY_CARE_PROVIDER_SITE_OTHER): Payer: BC Managed Care – PPO | Admitting: Neurology

## 2016-06-29 DIAGNOSIS — G40909 Epilepsy, unspecified, not intractable, without status epilepticus: Secondary | ICD-10-CM | POA: Diagnosis not present

## 2016-06-29 NOTE — Procedures (Signed)
    History:  Brett [name] is a 40 year old patient with a history of a seizure disorder with onset around age 1. The last seizure was around age 29 associated with stress from a divorce. The patient currently is on Lamictal, he is being considered for a taper off of the medication.  This is a routine EEG. No skull defects are noted. Medications include Lamictal and pravastatin.   EEG classification: Normal awake and drowsy  Description of the recording: The background rhythms of this recording consists of a fairly well modulated medium amplitude alpha rhythm of 10 Hz that is reactive to eye opening and closure. As the record progresses, the patient appears to remain in the waking state throughout the recording. Photic stimulation was performed, resulting in a bilateral and symmetric photic driving response. Hyperventilation was also performed, resulting in a minimal buildup of the background rhythm activities without significant slowing seen. Toward the end of the recording, the patient enters the drowsy state with slight symmetric slowing seen. The patient never enters stage II sleep. At no time during the recording does there appear to be evidence of spike or spike wave discharges or evidence of focal slowing. EKG monitor shows no evidence of cardiac rhythm abnormalities with a heart rate of 60.  Impression: This is a normal EEG recording in the waking and drowsy state. No evidence of ictal or interictal discharges are seen.

## 2016-07-01 ENCOUNTER — Ambulatory Visit (INDEPENDENT_AMBULATORY_CARE_PROVIDER_SITE_OTHER): Payer: BC Managed Care – PPO

## 2016-07-01 DIAGNOSIS — G40909 Epilepsy, unspecified, not intractable, without status epilepticus: Secondary | ICD-10-CM | POA: Diagnosis not present

## 2016-07-02 ENCOUNTER — Telehealth: Payer: Self-pay | Admitting: *Deleted

## 2016-07-02 MED ORDER — GADOPENTETATE DIMEGLUMINE 469.01 MG/ML IV SOLN: 16.0000 mL | Freq: Once | INTRAVENOUS | Status: DC | PRN

## 2016-07-02 NOTE — Telephone Encounter (Signed)
Per Dr Marjory Lies, spoke with patient and informed him his EEG results are normal.  He inquired if he needs to come in for follow up; advised that he does need to come in to further discuss medication for his seizures. He verbalized understanding, appreciation.

## 2016-07-06 ENCOUNTER — Telehealth: Payer: Self-pay | Admitting: *Deleted

## 2016-07-06 NOTE — Telephone Encounter (Signed)
Per Dr Marjory Lies, spoke with patient and informed him his MRI results are unremarkable. He inquired "Is that good or is that not good?" Advised him that unremarkable results means it is good. Reminded  Him of FU 08/03/16. He verbalized understanding, appreciation. Brett [name]

## 2016-08-03 ENCOUNTER — Encounter: Payer: Self-pay | Admitting: Diagnostic Neuroimaging

## 2016-08-03 ENCOUNTER — Ambulatory Visit (INDEPENDENT_AMBULATORY_CARE_PROVIDER_SITE_OTHER): Payer: BC Managed Care – PPO | Admitting: Diagnostic Neuroimaging

## 2016-08-03 VITALS — BP 137/82 | HR 78 | Wt 174.8 lb

## 2016-08-03 DIAGNOSIS — G40909 Epilepsy, unspecified, not intractable, without status epilepticus: Secondary | ICD-10-CM

## 2016-08-03 NOTE — Patient Instructions (Addendum)
continue lamotrigine

## 2016-08-03 NOTE — Progress Notes (Signed)
[facility]  PATIENT: Brett [name] DOB: 1976/12/07  REFERRING CLINICIAN: Lacretia Leigh HISTORY FROM: patient  REASON FOR VISIT: follow up   HISTORICAL  CHIEF COMPLAINT:  Chief Complaint  Patient presents with  . Seizures    rm 7, "no seizure activity since last office visit"  . Follow-up    6 week    HISTORY OF PRESENT ILLNESS:   UPDATE 08/03/16: Since last visit, doing well. No sz. MRI and EEG normal. 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   PRIOR HPI (06/17/16): 40 year old right-handed male here for evaluation of seizure disorder. Age 66 years old patient was living in [place], playing marbles with friends, when all of a sudden he had tunnel vision, loss of consciousness and generalized convulsions. Patient bit his tongue. He was diagnosed with seizure disorder. Initially this was treated conservatively by his local community physicians with vitamins. Eventually he went to a city and was diagnosed officially with seizure disorder and started on Dilantin. Patient was on therapy from age 36 until age 30 years old. By age 67 years old seizures had resolved. Age 23 years old patient moved to [place]. Patient had one more seizure at age 8 years old under significant stress from divorce/separation from his ex-wife. Around that time he was transitioned to lamotrigine. Since that time no further seizures. Patient reports family history of seizure in his own father, who possibly had neurocysticercosis.   REVIEW OF SYSTEMS: Full 14 system review of systems performed and negative with exception of: Not asleep insomnia snoring moles.  ALLERGIES: No Known Allergies  HOME MEDICATIONS: Outpatient Medications Prior to Visit  Medication Sig Dispense Refill  . lamoTRIgine (LAMICTAL) 100 MG tablet Take 1 tablet (100 mg total) by mouth daily. 90 tablet 3  . pravastatin (PRAVACHOL) 20 MG tablet Take 1 tablet (20 mg total) by mouth daily. 90 tablet 3   Facility-Administered  Medications Prior to Visit  Medication Dose Route Frequency Provider Last Rate Last Dose  . gadopentetate dimeglumine (MAGNEVIST) injection 16 mL  16 mL Intravenous Once PRN York Spaniel, MD        PAST MEDICAL HISTORY: Past Medical History:  Diagnosis Date  . Hyperlipidemia   . Mood disorder (HCC)   . Seizures (HCC)    hx of seizures as child, last sz > 15 years ago    PAST SURGICAL HISTORY: Past Surgical History:  Procedure Laterality Date  . APPENDECTOMY  2003    FAMILY HISTORY: Family History  Problem Relation Age of Onset  . Diabetes Mother   . Heart disease Mother   . Mental illness Mother     depression  . Seizures Father     possibly related to neurocystercercosis    SOCIAL HISTORY:  Social History   Social History  . Marital status: Married    Spouse name: Laurelyn Sickle  . Number of children: 3  . Years of education: 4   Occupational History  . 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 Smoking status: Never Smoker  . Smokeless tobacco: Never Used  . Alcohol use 1.2 oz/week    2 Cans of beer per week     Comment: sometimes  . Drug use: No  . Sexual activity: Yes   Other Topics Concern  . Not on file   Social History Narrative   Lives with wife and the two daughters they share.  His son lives with the son's mother.   From [place], [place];  came to the [place] in 1995.   Works for [employer]   Caffeine use- coffee 1 cup daily     PHYSICAL EXAM   GENERAL EXAM/CONSTITUTIONAL: Vitals:  Vitals:   08/03/16 0819  BP: 137/82  Pulse: 78  Weight: 174 lb 12.8 oz (79.3 kg)   Body mass index is 26.58 kg/m. No exam data present  Patient is in no distress; well developed, nourished and groomed; neck is supple  CARDIOVASCULAR:  Examination of carotid arteries is normal; no carotid bruits  Regular rate and rhythm, no murmurs  Examination of peripheral vascular system by observation and palpation is  normal  EYES:  Ophthalmoscopic exam of optic discs and posterior segments is normal; no papilledema or hemorrhages  MUSCULOSKELETAL:  Gait, strength, tone, movements noted in Neurologic exam below  NEUROLOGIC: MENTAL STATUS:  No flowsheet data found.  awake, alert, oriented to person, place and time  recent and remote memory intact  normal attention and concentration  language fluent, comprehension intact, naming intact,   fund of knowledge appropriate  CRANIAL NERVE:   2nd - no papilledema on fundoscopic exam  2nd, 3rd, 4th, 6th - pupils equal and reactive to light, visual fields full to confrontation, extraocular muscles intact, no nystagmus  5th - facial sensation symmetric  7th - facial strength symmetric  8th - hearing intact  9th - palate elevates symmetrically, uvula midline  11th - shoulder shrug symmetric  12th - tongue protrusion midline  MOTOR:   normal bulk and tone, full strength in the BUE, BLE  SENSORY:   normal and symmetric to light touch, temperature, vibration  COORDINATION:   finger-nose-finger, fine finger movements normal  REFLEXES:   deep tendon reflexes present and symmetric  GAIT/STATION:   narrow based gait; able to walk tandem; romberg is negative    DIAGNOSTIC DATA (LABS, IMAGING, TESTING) - I reviewed patient records, labs, notes, testing and imaging myself where available.  Lab Results  Component Value Date   WBC 5.3 11/16/2014   HGB 15.5 11/16/2014   HCT 47.9 11/16/2014   MCV 91.4 11/16/2014      Component Value Date/Time   NA 138 05/19/2016 1642   K 4.5 05/19/2016 1642   CL 102 05/19/2016 1642   CO2 25 05/19/2016 1642   GLUCOSE 92 05/19/2016 1642   BUN 16 05/19/2016 1642   CREATININE 0.82 05/19/2016 1642   CALCIUM 9.6 05/19/2016 1642   PROT 6.7 05/19/2016 1642   ALBUMIN 4.7 05/19/2016 1642   AST 21 05/19/2016 1642   ALT 20 05/19/2016 1642   ALKPHOS 49 05/19/2016 1642   BILITOT 0.6 05/19/2016 1642    Lab Results  Component Value Date   CHOL 204 (H) 05/19/2016   HDL 47 05/19/2016   LDLCALC 134 (H) 05/19/2016   TRIG 114 05/19/2016   CHOLHDL 4.3 05/19/2016   Lab Results  Component Value Date   HGBA1C 5.9 (H) 05/19/2016   No results found for: VITAMINB12 No results found for: TSH  07/01/16 MRI brain  - normal  06/29/16 EEG  - normal    ASSESSMENT AND PLAN  40 y.o. year old male here with history of seizure disorder, since age [age] years old, last seizure at age 89 years old. Patient doing well on lamotrigine. Has family history of seizure disorder in his father, possibly related to neurocysticercosis. MRI brain and EEG are normal.   Dx:   1. Seizure disorder (HCC)      PLAN: I spent 25 minutes of  face to face time with patient. Greater than 50% of time was spent in counseling and coordination of care with patient. In summary we discussed: - continue lamotrigine for now; in future, could consider tapering, but this would require short term driving restriction (~1-4 months) to ensure safety  Return in about 6 months (around 02/03/2017).     Suanne Marker, MD 08/03/2016, 8:38 AM Certified in Neurology, Neurophysiology and Neuroimaging  [facility] [address], [address] [place], [place] [address] [phone]

## 2016-08-18 ENCOUNTER — Encounter: Payer: Self-pay | Admitting: Physician Assistant

## 2016-08-18 ENCOUNTER — Ambulatory Visit (INDEPENDENT_AMBULATORY_CARE_PROVIDER_SITE_OTHER): Payer: BC Managed Care – PPO | Admitting: Physician Assistant

## 2016-08-18 VITALS — BP 120/78 | HR 66 | Temp 97.8°F | Resp 17 | Ht 68.0 in | Wt 176.0 lb

## 2016-08-18 DIAGNOSIS — G40909 Epilepsy, unspecified, not intractable, without status epilepticus: Secondary | ICD-10-CM | POA: Diagnosis not present

## 2016-08-18 DIAGNOSIS — K121 Other forms of stomatitis: Secondary | ICD-10-CM

## 2016-08-18 DIAGNOSIS — R739 Hyperglycemia, unspecified: Secondary | ICD-10-CM

## 2016-08-18 DIAGNOSIS — E785 Hyperlipidemia, unspecified: Secondary | ICD-10-CM | POA: Diagnosis not present

## 2016-08-18 LAB — LIPID PANEL
CHOL/HDL RATIO: 6 ratio — AB (ref <=5.0)
CHOLESTEROL: 217 mg/dL — AB (ref 125–200)
HDL: 36 mg/dL — AB (ref >=40)
LDL Cholesterol: 150 mg/dL — ABNORMAL HIGH (ref <130)
Triglycerides: 155 mg/dL — ABNORMAL HIGH (ref <150)
VLDL: 31 mg/dL — AB (ref <30)

## 2016-08-18 LAB — COMPREHENSIVE METABOLIC PANEL
ALBUMIN: 4.5 g/dL (ref 3.6–5.1)
ALT: 19 U/L (ref 9–46)
AST: 18 U/L (ref 10–40)
Alkaline Phosphatase: 55 U/L (ref 40–115)
BUN: 11 mg/dL (ref 7–25)
CALCIUM: 9.7 mg/dL (ref 8.6–10.3)
CHLORIDE: 100 mmol/L (ref 98–110)
CO2: 30 mmol/L (ref 20–31)
Creat: 0.73 mg/dL (ref 0.60–1.35)
GLUCOSE: 111 mg/dL — AB (ref 65–99)
Potassium: 4.5 mmol/L (ref 3.5–5.3)
SODIUM: 138 mmol/L (ref 135–146)
Total Bilirubin: 1.1 mg/dL (ref 0.2–1.2)
Total Protein: 7.1 g/dL (ref 6.1–8.1)

## 2016-08-18 LAB — HEMOGLOBIN A1C
Hgb A1c MFr Bld: 5.6 % (ref <5.7)
MEAN PLASMA GLUCOSE: 114 mg/dL

## 2016-08-18 MED ORDER — CHLORHEXIDINE GLUCONATE 0.12 % MT SOLN: 15.0000 mL | Freq: Two times a day (BID) | OROMUCOSAL | 0 refills | Status: AC

## 2016-08-18 NOTE — Patient Instructions (Addendum)
Please keep taking the Lamictal until you follow-up with Dr. Marjory LiesPenumalli. You probably don't need it, but you would need to avoid driving for 3-6 months if you decide to stop it, due to the potential risk of having a seizure when you stop it. If you want to consider stopping it before you see Dr. Marjory LiesPenumalli, please call his office and discuss it with him first.    IF you received an x-ray today, you will receive an invoice from [facility]. Please contact [facility] at [phone] with questions or concerns regarding your invoice.   IF you received labwork today, you will receive an invoice from [facility]. Please contact [facility] at [phone] with questions or concerns regarding your invoice.   Our billing staff will not be able to assist you with questions regarding bills from these companies.  You will be contacted with the lab results as soon as they are available. The fastest way to get your results is to activate your My Chart account. Instructions are located on the last page of this paperwork. If you have not heard from us regarding the results in 2 weeks, please contact this office.

## 2016-08-18 NOTE — Progress Notes (Signed)
Patient ID: Brett [name], male    DOB: Aug 03, 1976, 40 y.o.   MRN: [id number]  PCP: Porfirio Oarhelle Gilbert Narain, PA-C  Subjective:   Chief Complaint  Patient presents with  . Follow-up    Pt unsure as to why he is here.     HPI Presents for evaluation of hyperlipidemia and hyperglycemia.  Since his last visit, he has seen neurology regarding seizure disorder. His lamotrigine levels have been low for some time, and suggests that he may no longer need it.  Unfortunately, a trial off would require that he not drive for 3-6 months, and he is not ready to do that. He is tolerating the lamotrigine without difficulty or adverse effects. He will follow-up with neurology in 6 months to revisit the situation.  He has recently seen a dentist and is taking amoxicillin for an infection. He continues to have pain at the site. He is scheduled to go back to the dentist next week.  He continues to tolerate pravastatin without side effects. No CP, SOB, HA, dizziness. No increased thirst, urinary frequency.    Review of Systems As above.    Patient Active Problem List   Diagnosis Date Noted  . Seizure disorder (HCC) 09/27/2012  . Hyperlipidemia 09/27/2012  . Mood disorder (HCC) 09/27/2012     Prior to Admission medications   Medication Sig Start Date End Date Taking? Authorizing Provider  amoxicillin (AMOXIL) 500 MG capsule  08/14/16 08/27/16 Yes Historical Provider, MD  HYDROcodone-acetaminophen (NORCO/VICODIN) 5-325 MG tablet Take 1 tablet by mouth every 6 (six) hours as needed for moderate pain.   Yes Historical Provider, MD  ibuprofen (ADVIL,MOTRIN) 800 MG tablet Take 800 mg by mouth every 8 (eight) hours as needed.   Yes Historical Provider, MD  lamoTRIgine (LAMICTAL) 100 MG tablet Take 1 tablet (100 mg total) by mouth daily. 02/15/16  Yes Emani Morad, PA-C  pravastatin (PRAVACHOL) 20 MG tablet Take 1 tablet (20 mg total) by mouth daily. 02/11/16  Yes Joell Buerger, PA-C     No Known  Allergies     Objective:  Physical Exam  Constitutional: He is oriented to person, place, and time. He appears well-developed and well-nourished. He is active and cooperative. No distress.  BP 120/78 (BP Location: Right Arm, Patient Position: Sitting, Cuff Size: Large)   Pulse 66   Temp 97.8 F (36.6 C) (Oral)   Resp 17   Ht 5\' 8"  (1.727 m)   Wt 176 lb (79.8 kg)   SpO2 99%   BMI 26.76 kg/m   HENT:  Head: Normocephalic and atraumatic.  Right Ear: Hearing normal.  Left Ear: Hearing normal.  Mouth/Throat: Uvula is midline, oropharynx is clear and moist and mucous membranes are normal. Oral lesions present. Abnormal dentition (fair repair). Dental caries present. No dental abscesses or uvula swelling.    Eyes: Conjunctivae are normal. No scleral icterus.  Neck: Normal range of motion. Neck supple. No thyromegaly present.  Cardiovascular: Normal rate, regular rhythm and normal heart sounds.   Pulses:      Radial pulses are 2+ on the right side, and 2+ on the left side.  Pulmonary/Chest: Effort normal and breath sounds normal.  Lymphadenopathy:       Head (right side): No tonsillar, no preauricular, no posterior auricular and no occipital adenopathy present.       Head (left side): No tonsillar, no preauricular, no posterior auricular and no occipital adenopathy present.    He has no cervical adenopathy.  Right: No supraclavicular adenopathy present.       Left: No supraclavicular adenopathy present.  Neurological: He is alert and oriented to person, place, and time. No sensory deficit.  Skin: Skin is warm, dry and intact. No rash noted. No cyanosis or erythema. Nails show no clubbing.  Psychiatric: He has a normal mood and affect. His speech is normal and behavior is normal.           Assessment & Plan:   1. Hyperlipidemia Await labs. Adjust regimen as indicated by results. - Lipid panel - Comprehensive metabolic panel  2. Hyperglycemia Await labs. Consider  initiation of metformin if indicated by results. - Hemoglobin A1c  3. Seizure disorder (HCC) Continue follow-up with Dr. Marjory Lies as recommended. Recommend that he continue lamotrigine until he can avoid driving x 3-6 months for a trial off the medication.   4. Ulceration of oral mucosa Possible trauma from recent dental work, but also possible reaction of mucosa to fair-poor condition of gingiva and recent aggravation from repair work. Continue antibiotic prescribed by his dentist. Add chlorhexidine. - chlorhexidine (PERIDEX) 0.12 % solution; Use as directed 15 mLs in the mouth or throat 2 (two) times daily.  Dispense: 120 mL; Refill: 0   Fernande Bras, PA-C Physician Assistant-Certified [facility]

## 2016-08-18 NOTE — Progress Notes (Signed)
   Subjective:    Patient ID: Brett [name], male    DOB: [birth year], 40 y.o.   MRN: [id number]  Chief Complaint  Patient presents with  . Follow-up    Pt unsure as to why he is here.     HPI Patient is a 40 yo Hispanic male with a hx of seizure disorder, hyperlipidemia, hyperglycemia, presents today for a follow-up for his hyperlipidemia and hyperglycemia. Patient has no complaints. All chronic disorders are relatively well controlled on medication. He has not had a seizure in the past 15 years. EEG results with neurologist 2 weeks were unremarkable. Patient will follow-up with neurologist regarding in 6 months regarding his medication.    Review of Systems  Constitutional: Negative for appetite change, fatigue and unexpected weight change.  HENT: Positive for mouth sores (Recent dental Surgery). Negative for hearing loss and trouble swallowing.   Eyes: Negative for visual disturbance.  Respiratory: Negative for cough, chest tightness and shortness of breath.   Cardiovascular: Negative for chest pain.  Gastrointestinal: Negative for diarrhea, nausea and vomiting.  Skin: Negative for rash.  Neurological: Negative for dizziness, seizures, weakness, numbness and headaches.  Psychiatric/Behavioral: Negative for dysphoric mood.      Objective:   Physical Exam  Constitutional: He appears well-developed and well-nourished. No distress.  HENT:  Head: Normocephalic and atraumatic.  Right Ear: External ear normal.  Left Ear: External ear normal.  Nose: Nose normal.  Mouth/Throat: Oropharyngeal exudate present.  Eyes: Conjunctivae and EOM are normal. Pupils are equal, round, and reactive to light. Right eye exhibits no discharge. Left eye exhibits no discharge. No scleral icterus.  Neck: Normal range of motion. Neck supple.  Cardiovascular: Normal rate, regular rhythm, normal heart sounds and intact distal pulses.   Pulmonary/Chest: Effort normal and breath sounds normal. No respiratory  distress.  Abdominal: Soft. He exhibits no distension. There is no tenderness.  Lymphadenopathy:    He has no cervical adenopathy.  Neurological: He is alert. No cranial nerve deficit.  Skin: Skin is warm and dry.  Psychiatric: He has a normal mood and affect.      Assessment & Plan:  1. Hyperlipidemia Relatively well controlled on medication - Lipid panel - Comprehensive metabolic panel  2. Hyperglycemia Previous A1C was 5.9 six months ago. Relatively well controlled  - Hemoglobin A1c  3. Seizure disorder (HCC) EEG results are unremarkable. Neurologist decreased dose of Lamictal from 200mg  to 100mg  and will follow-up 6 months   4. Ulceration of oral mucosa  Most likely consistent with oral surgery injury a few days ago.  - chlorhexidine (PERIDEX) 0.12 % solution; Use as directed 15 mLs in the mouth or throat 2 (two) times daily.  Dispense: 120 mL; Refill: 0

## 2017-02-03 ENCOUNTER — Ambulatory Visit: Payer: BC Managed Care – PPO | Admitting: Diagnostic Neuroimaging

## 2017-10-28 ENCOUNTER — Other Ambulatory Visit: Payer: Self-pay | Admitting: Physician Assistant

## 2017-10-28 DIAGNOSIS — G40909 Epilepsy, unspecified, not intractable, without status epilepticus: Secondary | ICD-10-CM

## 2017-10-31 ENCOUNTER — Other Ambulatory Visit: Payer: Self-pay | Admitting: Physician Assistant

## 2017-10-31 DIAGNOSIS — G40909 Epilepsy, unspecified, not intractable, without status epilepticus: Secondary | ICD-10-CM

## 2017-12-31 ENCOUNTER — Other Ambulatory Visit: Payer: Self-pay | Admitting: Physician Assistant

## 2017-12-31 DIAGNOSIS — G40909 Epilepsy, unspecified, not intractable, without status epilepticus: Secondary | ICD-10-CM

## 2017-12-31 NOTE — Telephone Encounter (Signed)
Called pt to let them know about their refill. Let him know that he would not receive anymore refills until he came in for an OV with Chelle. I made him an appt for 01/12/18.  Thanks!

## 2018-01-12 ENCOUNTER — Ambulatory Visit: Payer: BC Managed Care – PPO | Admitting: Physician Assistant

## 2018-01-12 ENCOUNTER — Other Ambulatory Visit: Payer: Self-pay

## 2018-01-12 ENCOUNTER — Encounter: Payer: Self-pay | Admitting: Physician Assistant

## 2018-01-12 VITALS — BP 104/60 | HR 85 | Temp 98.0°F | Resp 18 | Ht 68.0 in | Wt 177.6 lb

## 2018-01-12 DIAGNOSIS — F39 Unspecified mood [affective] disorder: Secondary | ICD-10-CM

## 2018-01-12 DIAGNOSIS — G40909 Epilepsy, unspecified, not intractable, without status epilepticus: Secondary | ICD-10-CM

## 2018-01-12 DIAGNOSIS — E785 Hyperlipidemia, unspecified: Secondary | ICD-10-CM | POA: Diagnosis not present

## 2018-01-12 MED ORDER — LAMOTRIGINE 100 MG PO TABS: ORAL_TABLET | ORAL | 3 refills | Status: DC

## 2018-01-12 MED ORDER — PRAVASTATIN SODIUM 20 MG PO TABS: 20.0000 mg | ORAL_TABLET | Freq: Every day | ORAL | 3 refills | Status: DC

## 2018-01-12 NOTE — Patient Instructions (Signed)
     IF you received an x-ray today, you will receive an invoice from [facility]. Please contact [facility] at [phone] with questions or concerns regarding your invoice.   IF you received labwork today, you will receive an invoice from [facility]. Please contact [facility] at [phone] with questions or concerns regarding your invoice.   Our billing staff will not be able to assist you with questions regarding bills from these companies.  You will be contacted with the lab results as soon as they are available. The fastest way to get your results is to activate your My Chart account. Instructions are located on the last page of this paperwork. If you have not heard from us regarding the results in 2 weeks, please contact this office.     

## 2018-01-12 NOTE — Progress Notes (Signed)
Patient ID: Brett [name], male    DOB: [birth year], 42 y.o.   MRN: [id number]  PCP: Porfirio OarJeffery, Sakai Heinle, PA-C  Chief Complaint  Patient presents with  . Medication Refill    lamictal     Subjective:   Presents for evaluation of seizure disorder and hyperlipidemia.  Last visit was 08/2016. LDL had gone up. Lamotrigine level has been low x 4+ years. He has seen neurology, most recently 07/2016. Tapering off would require a 3-6 month driving restriction.  He is not fasting. Ate pizza about 11 am today. Has not had pravastatin in over a year.  He has had another child, a son, now 396 months old, since I last saw him.   Review of Systems  Constitutional: Negative for chills and fever.  Respiratory: Negative for cough and shortness of breath.   Cardiovascular: Negative for chest pain, palpitations and leg swelling.  Gastrointestinal: Negative for diarrhea, nausea and vomiting.  Endocrine: Negative for polydipsia.  Genitourinary: Negative for dysuria, frequency and urgency.  Musculoskeletal: Negative for myalgias.  Skin: Negative for rash.  Neurological: Negative for dizziness, seizures and headaches.   Depression screen [facility] 2/9 01/12/2018 08/18/2016 05/19/2016 02/11/2016  Decreased Interest 0 0 0 0  Down, Depressed, Hopeless 0 0 0 0  PHQ - 2 Score 0 0 0 0       Patient Active Problem List   Diagnosis Date Noted  . Seizure disorder (HCC) 09/27/2012  . Hyperlipidemia 09/27/2012  . Mood disorder (HCC) 09/27/2012     Prior to Admission medications   Medication Sig Start Date End Date Taking? Authorizing Provider  lamoTRIgine (LAMICTAL) 100 MG tablet TAKE 1 TABLET BY MOUTH EVERY DAY. NEEDS OFFICE VISIT FOR REFILLS 12/31/17  Yes Jonnie Truxillo, PA-C  pravastatin (PRAVACHOL) 20 MG tablet Take 1 tablet (20 mg total) by mouth daily. Patient not taking: Reported on 01/12/2018 02/11/16   Porfirio OarJeffery, Edoardo Laforte, PA-C     No Known Allergies     Objective:  Physical Exam  Constitutional:  He is oriented to person, place, and time. He appears well-developed and well-nourished. He is active and cooperative. No distress.  BP 104/60   Pulse 85   Temp 98 F (36.7 C) (Oral)   Resp 18   Ht 5\' 8"  (1.727 m)   Wt 177 lb 9.6 oz (80.6 kg)   SpO2 97%   BMI 27.00 kg/m   HENT:  Head: Normocephalic and atraumatic.  Right Ear: Hearing normal.  Left Ear: Hearing normal.  Eyes: Conjunctivae are normal. No scleral icterus.  Neck: Normal range of motion. Neck supple. No thyromegaly present.  Cardiovascular: Normal rate, regular rhythm and normal heart sounds.  Pulses:      Radial pulses are 2+ on the right side, and 2+ on the left side.  Pulmonary/Chest: Effort normal and breath sounds normal.  Lymphadenopathy:       Head (right side): No tonsillar, no preauricular, no posterior auricular and no occipital adenopathy present.       Head (left side): No tonsillar, no preauricular, no posterior auricular and no occipital adenopathy present.    He has no cervical adenopathy.       Right: No supraclavicular adenopathy present.       Left: No supraclavicular adenopathy present.  Neurological: He is alert and oriented to person, place, and time. No sensory deficit.  Skin: Skin is warm, dry and intact. No rash noted. No cyanosis or erythema. Nails show no clubbing.  Psychiatric: He has  a normal mood and affect. His speech is normal and behavior is normal.      Assessment & Plan:   Problem List Items Addressed This Visit    Seizure disorder (HCC) - Primary    He will continue lamotrigine for now.  He is not able to avoid driving for the required time.  Update lamotrigine level at next visit.      Relevant Medications   lamoTRIgine (LAMICTAL) 100 MG tablet   Hyperlipidemia    Resume pravastatin.  Update lipids and metabolic profile at next visit, fasting.      Relevant Medications   pravastatin (PRAVACHOL) 20 MG tablet   Mood disorder (HCC)    Stable.  Lamotrigine may be providing  mood stabilization.          Return in about 3 months (around 04/11/2018) for Annual exam and fasting labs.   Fernande Bras, PA-C [facility]

## 2018-02-02 NOTE — Assessment & Plan Note (Signed)
Stable.  Lamotrigine may be providing mood stabilization.

## 2018-02-02 NOTE — Assessment & Plan Note (Signed)
Resume pravastatin.  Update lipids and metabolic profile at next visit, fasting.

## 2018-02-02 NOTE — Assessment & Plan Note (Signed)
He will continue lamotrigine for now.  He is not able to avoid driving for the required time.  Update lamotrigine level at next visit.

## 2018-03-10 ENCOUNTER — Encounter: Payer: Self-pay | Admitting: Physician Assistant

## 2018-04-13 ENCOUNTER — Encounter: Payer: BC Managed Care – PPO | Admitting: Physician Assistant

## 2018-04-14 ENCOUNTER — Encounter: Payer: Self-pay | Admitting: Physician Assistant

## 2018-04-14 ENCOUNTER — Ambulatory Visit (INDEPENDENT_AMBULATORY_CARE_PROVIDER_SITE_OTHER): Payer: BC Managed Care – PPO | Admitting: Physician Assistant

## 2018-04-14 ENCOUNTER — Other Ambulatory Visit: Payer: Self-pay

## 2018-04-14 VITALS — BP 138/82 | HR 69 | Temp 97.8°F | Resp 16 | Ht 68.0 in | Wt 173.8 lb

## 2018-04-14 DIAGNOSIS — Z113 Encounter for screening for infections with a predominantly sexual mode of transmission: Secondary | ICD-10-CM

## 2018-04-14 DIAGNOSIS — E785 Hyperlipidemia, unspecified: Secondary | ICD-10-CM

## 2018-04-14 DIAGNOSIS — Z13228 Encounter for screening for other metabolic disorders: Secondary | ICD-10-CM

## 2018-04-14 DIAGNOSIS — R7309 Other abnormal glucose: Secondary | ICD-10-CM | POA: Diagnosis not present

## 2018-04-14 DIAGNOSIS — Z0000 Encounter for general adult medical examination without abnormal findings: Secondary | ICD-10-CM

## 2018-04-14 DIAGNOSIS — Z114 Encounter for screening for human immunodeficiency virus [HIV]: Secondary | ICD-10-CM

## 2018-04-14 DIAGNOSIS — F39 Unspecified mood [affective] disorder: Secondary | ICD-10-CM

## 2018-04-14 DIAGNOSIS — Z130 Encounter for screening for diseases of the blood and blood-forming organs and certain disorders involving the immune mechanism: Secondary | ICD-10-CM | POA: Diagnosis not present

## 2018-04-14 DIAGNOSIS — Z1329 Encounter for screening for other suspected endocrine disorder: Secondary | ICD-10-CM

## 2018-04-14 DIAGNOSIS — G40909 Epilepsy, unspecified, not intractable, without status epilepticus: Secondary | ICD-10-CM | POA: Diagnosis not present

## 2018-04-14 MED ORDER — LAMOTRIGINE 100 MG PO TABS: ORAL_TABLET | ORAL | 3 refills | Status: AC

## 2018-04-14 MED ORDER — PRAVASTATIN SODIUM 20 MG PO TABS: 20.0000 mg | ORAL_TABLET | Freq: Every day | ORAL | 3 refills | Status: DC

## 2018-04-14 NOTE — Patient Instructions (Addendum)
Go ahead and call [facility] to schedule your 6 month visit to recheck the cholesterol. [phone]   Below are some eye specialists that I recommend nearby!  EYE Specialists: [facility]  [address], [place], [place] [address]  Phone: [phone]  [facility] [address] [place], [place], [place] [address]  Phone: [phone]   IF you received an x-ray today, you will receive an invoice from [facility]. Please contact [facility] at [phone] with questions or concerns regarding your invoice.   IF you received labwork today, you will receive an invoice from [facility]. Please contact [facility] at [phone] with questions or concerns regarding your invoice.   Our billing staff will not be able to assist you with questions regarding bills from these companies.  You will be contacted with the lab results as soon as they are available. The fastest way to get your results is to activate your My Chart account. Instructions are located on the last page of this paperwork. If you have not heard from [place] regarding the results in 2 weeks, please contact this office.       Cuidados preventivos en los hombres de 40 a 64 aos de edad Preventive Care 40-64 Years, Male Los cuidados preventivos hacen referencia a las opciones en cuanto al estilo de vida y a las visitas al mdico, las cuales pueden promover la salud y Counsellor. Qu incluyen los cuidados preventivos?  Un examen fsico anual. Esto tambin se conoce como control de bienestar anual.  Exmenes dentales [facility] al ao.  Exmenes de la vista de rutina. Pregntele al mdico con qu frecuencia debe realizarse un control de la vista.  Opciones personales de estilo de vida, que incluyen lo siguiente: ? [employer] y las encas a diario. ? Realizar actividad fsica con regularidad. ? Tener una dieta saludable. ? Evitar el consumo de tabaco y drogas. ? Limitar el  consumo de bebidas alcohlicas. ? Marine scientist. ? Tomar una dosis baja de aspirina a diario a partir de los 50 aos de [place]. Qu sucede durante un control de bienestar anual? Los servicios y exmenes de deteccin realizados por su mdico durante el control de bienestar anual dependern de su salud general, factores de riesgo de estilo de vida y los antecedentes familiares de enfermedades. Asesoramiento Su mdico puede preguntarle acerca de:  Consumo de alcohol.  Consumo de tabaco.  Consumo de drogas.  Bienestar emocional.  Bienestar en el hogar y las relaciones personales.  Actividad sexual.  Hbitos de alimentacin.  Trabajo y [place] laboral.  Pruebas de deteccin Pueden hacerle las siguientes pruebas o mediciones:  Diplomatic Services operational officer, peso e ndice de masa muscular [facility]).  Presin arterial.  Niveles de lpidos y colesterol. Estos se pueden verificar cada 5 aos o, con ms frecuencia, si usted tiene ms de 50 aos de edad.  Control de la piel.  Pruebas de deteccin de cncer de pulmn. Es posible que se le realice esta prueba de deteccin a partir de los 55 aos de edad, si ha fumado durante 30 aos un paquete diario y sigue fumando o dej el hbito en algn momento en los ltimos 15 aos.  Prueba de Public house manager en las heces [facility]). Es posible que se le realice esta prueba todos los aos a partir de los 50 aos de [place].  Sigmoidoscopa o colonoscopa flexible. Es posible que se le realice una sigmoidoscopa cada 5 aos o una colonoscopa cada 10 aos  a partir de los [place] de [place].  Examen de deteccin del cncer de prstata. Las recomendaciones variarn segn sus antecedentes familiares y Civil Service fast streamer.  Anlisis de sangre para la deteccin de la hepatitis C.  Anlisis de sangre para la deteccin de la hepatitis B.  Anlisis de enfermedades de transmisin sexual (ETS).  Pruebas de deteccin de la diabetes. Esto se Physiological scientist un control del azcar en la  sangre (glucosa) despus de no haber comido durante un periodo de tiempo (ayuno). Es posible que se le realice esta prueba cada 1 a 3 tres aos.  Hable con su mdico para [employer], las opciones de tratamiento y, si corresponde, la necesidad de Education officer, environmental ms pruebas. Vacunas El mdico puede recomendarle que se aplique algunas vacunas, por ejemplo:  Vacuna contra la gripe. Se recomienda aplicarse esta vacuna todos los aos.  Vacuna contra la difteria, ttanos y tos Teacher, early years/pre (DTPa, DT). Es posible que tenga que aplicarse un refuerzo contra el ttanos y la difteria (DT) cada 10aos.  Vacuna contra la varicela. Es posible que tenga que aplicrsela si no recibi esta vacuna.  Vacuna contra el herpes zster. Es posible que la necesite despus de los 60 aos de edad.  Vacuna contra el sarampin, rubola y paperas (SRP). Es posible que necesite aplicarse al menos una dosis de la vacuna SRP si naci despus de [phone]. Podra tambin necesitar una segunda dosis.  Vacuna antineumoccica conjugada 13 valente (PCV13). Puede necesitar esta vacuna si tiene determinadas enfermedades y no se vacun anteriormente.  Vacuna antineumoccica de polisacridos (PPSV23). Quizs tenga que aplicarse una o dos dosis si fuma o si sufre determinadas enfermedades.  Vacuna antimeningoccica. Puede necesitar esta vacuna si tiene determinadas afecciones.  Vacuna contra la hepatitis A. Es posible que necesite esta vacuna si tiene ciertas afecciones o si viaja o trabaja en lugares en los que podra estar expuesto a la hepatitis A.  Vacuna contra la hepatitis B. Es posible que necesite esta vacuna si tiene ciertas afecciones o si viaja o trabaja en lugares en los que podra estar expuesto a la hepatitis B.  Vacuna contra antihaemophilus influenzae tipoB (Hib). Es posible que necesite esta vacuna si tiene determinados factores de [place].  Hable con el mdico sobre qu pruebas de deteccin y qu vacunas  necesita, y con qu frecuencia las necesita. Esta informacin no tiene Theme park manager el consejo del mdico. Asegrese de hacerle al mdico cualquier pregunta que tenga. Document Released: 04/06/2017 Document Revised: 04/06/2017 Document Reviewed: 09/24/2015 Elsevier Interactive Patient Education  2018 [employer].

## 2018-04-14 NOTE — Assessment & Plan Note (Signed)
Tolerates pravastatin. Await labs and adjust dose if needed. Healthy lifestyle choices encouraged.

## 2018-04-14 NOTE — Progress Notes (Signed)
Patient ID: Brett [name], male    DOB: 1976-02-23, 42 y.o.   MRN: [id number]  PCP: Porfirio Oar, PA-C  Chief Complaint  Patient presents with  . Annual Exam    Subjective:   Presents for Brett [name].  He feels well, without problems or concerns today. Does not follow with neurology, has elected against tapering off lamotrigine, as he is not able to refrain from driving for the 3-6 month taper that would be required.  Colorectal Cancer Screening: not yet a candidate Prostate Cancer Screening: not yet Bone Density Testing: not yet a candidate HIV Screening: update today, negative screening 02/11/2016 STI Screening: update today Seasonal Influenza Vaccination: recommend annually Td/Tdap Vaccination: 10/23/2010 Pneumococcal Vaccination: not yet a candidate Zoster Vaccination: not yet a candidate Frequency of Dental evaluation: Q3 months Frequency of Eye evaluation: never    Review of Systems  Constitutional: Negative for chills and fever.  HENT: Negative for congestion and sore throat.   Eyes: Negative for pain.  Respiratory: Negative for cough, shortness of breath and wheezing.   Cardiovascular: Negative for chest pain, palpitations and leg swelling.  Gastrointestinal: Negative for abdominal pain, blood in stool, constipation, diarrhea, nausea and vomiting.  Endocrine: Negative for polydipsia.  Genitourinary: Negative for dysuria, frequency, hematuria and urgency.  Musculoskeletal: Negative for back pain, myalgias and neck pain.  Skin: Negative for rash.  Allergic/Immunologic: Negative for environmental allergies.  Neurological: Negative for dizziness, weakness and headaches.  Hematological: Does not bruise/bleed easily.  Psychiatric/Behavioral: Negative for suicidal ideas. The patient is not nervous/anxious.     Patient Active Problem List   Diagnosis Date Noted  . Seizure disorder (HCC) 09/27/2012  . Hyperlipidemia 09/27/2012  . Mood disorder (HCC)  09/27/2012    Past Medical History:  Diagnosis Date  . Hyperlipidemia   . Mood disorder (HCC)   . Seizures (HCC)    hx of seizures as child, last sz > 15 years ago     Prior to Admission medications   Medication Sig Start Date End Date Taking? Authorizing Provider  lamoTRIgine (LAMICTAL) 100 MG tablet TAKE 1 TABLET BY MOUTH EVERY DAY. 01/12/18  Yes Rolando Whitby, PA-C  pravastatin (PRAVACHOL) 20 MG tablet Take 1 tablet (20 mg total) by mouth daily. 01/12/18  Yes Porfirio Oar, PA-C    No Known Allergies  Past Surgical History:  Procedure Laterality Date  . APPENDECTOMY  2003    Family History  Problem Relation Age of Onset  . Diabetes Mother   . Heart disease Mother   . Mental illness Mother        depression  . Seizures Father        possibly related to neurocystercercosis    Social History   Socioeconomic History  . Marital status: Married    Spouse name: Brett [name]  . Number of children: 4  . Years of education: Not on file  . Highest education level: 5th grade  Occupational History  . Occupation: Biochemist, clinical: [employer]  Social Needs  . Financial resource strain: Not on file  . Food insecurity:    Worry: Not on file    Inability: Not on file  . Transportation needs:    Medical: Not on file    Non-medical: Not on file  Tobacco Use  . Smoking status: Never Smoker  . Smokeless tobacco: Never Used  Substance and Sexual Activity  . Alcohol use: Yes    Alcohol/week: 1.2 oz  Types: 2 Cans of beer per week    Comment: sometimes  . Drug use: No  . Sexual activity: Yes  Lifestyle  . Physical activity:    Days per week: Not on file    Minutes per session: Not on file  . Stress: Not on file  Relationships  . Social connections:    Talks on phone: Not on file    Gets together: Not on file    Attends religious service: Not on file    Active member of club or organization: Not on file    Attends meetings of clubs or organizations:  Not on file    Relationship status: Not on file  Other Topics Concern  . Not on file  Social History Narrative   Lives with wife and the two daughters and one son they share.  His older son lives with the son's mother.   From [place], [place]; came to the [place] in 1995.   Works for [employer]   Caffeine use- coffee 1 cup daily        Objective:  Physical Exam  Constitutional: He is oriented to person, place, and time. He appears well-developed and well-nourished. He is active and cooperative.  Non-toxic appearance. He does not have a sickly appearance. He does not appear ill. No distress.  BP 138/82   Pulse 69   Temp 97.8 F (36.6 C)   Resp 16   Ht  (1.727 m)   Wt 173 lb 12.8 oz (78.8 kg)   SpO2 98%   BMI 26.43 kg/m    HENT:  Head: Normocephalic and atraumatic.  Right Ear: Hearing, tympanic membrane, external ear and ear canal normal.  Left Ear: Hearing, tympanic membrane, external ear and ear canal normal.  Nose: Nose normal.  Mouth/Throat: Uvula is midline, oropharynx is clear and moist and mucous membranes are normal. He does not have dentures. No oral lesions. No trismus in the jaw. Normal dentition. No dental abscesses, uvula swelling, lacerations or dental caries.  Eyes: Pupils are equal, round, and reactive to light. Conjunctivae, EOM and lids are normal. Right eye exhibits no discharge. Left eye exhibits no discharge. No scleral icterus.  Fundoscopic exam:      The right eye shows no arteriolar narrowing, no AV nicking, no exudate, no hemorrhage and no papilledema.       The left eye shows no arteriolar narrowing, no AV nicking, no exudate, no hemorrhage and no papilledema.  Neck: Normal range of motion, full passive range of motion without pain and phonation normal. Neck supple. No spinous process tenderness and no muscular tenderness present. No neck rigidity. No tracheal deviation, no edema, no erythema and normal range of motion present. No thyromegaly  present.  Cardiovascular: Normal rate, regular rhythm, S1 normal, S2 normal, normal heart sounds, intact distal pulses and normal pulses. Exam reveals no gallop and no friction rub.  No murmur heard. Pulmonary/Chest: Effort normal and breath sounds normal. No respiratory distress. He has no wheezes. He has no rales.  Abdominal: Soft. Normal appearance and bowel sounds are normal. He exhibits no distension and no mass. There is no hepatosplenomegaly. There is no tenderness. There is no rebound and no guarding. No hernia.  Musculoskeletal: Normal range of motion. He exhibits no edema or tenderness.       Cervical back: Normal. He exhibits normal range of motion, no tenderness, no bony tenderness, no swelling, no edema, no deformity, no laceration, no pain, no spasm and normal pulse.  Thoracic back: Normal.       Lumbar back: Normal.  Lymphadenopathy:       Head (right side): No submental, no submandibular, no tonsillar, no preauricular, no posterior auricular and no occipital adenopathy present.       Head (left side): No submental, no submandibular, no tonsillar, no preauricular, no posterior auricular and no occipital adenopathy present.    He has no cervical adenopathy.       Right: No supraclavicular adenopathy present.       Left: No supraclavicular adenopathy present.  Neurological: He is alert and oriented to person, place, and time. He has normal strength and normal reflexes. He displays no tremor and normal reflexes. No cranial nerve deficit. He exhibits normal muscle tone. Coordination and gait normal.  Skin: Skin is warm, dry and intact. No abrasion, no ecchymosis, no laceration, no lesion and no rash noted. He is not diaphoretic. No cyanosis or erythema. No pallor. Nails show no clubbing.  Psychiatric: He has a normal mood and affect. His speech is normal and behavior is normal. Judgment and thought content normal. Cognition and memory are normal.     Wt Readings from Last 3  Encounters:  04/14/18 173 lb 12.8 oz (78.8 kg)  01/12/18 177 lb 9.6 oz (80.6 kg)  08/18/16 176 lb (79.8 kg)     Visual Acuity Screening   Right eye Left eye Both eyes  Without correction:  With correction:            Assessment & Plan:   Problem List Items Addressed This Visit    Seizure disorder (HCC)    Well controlled on lamotrigine. Is not following with neurology. Continue current treatment.      Relevant Medications   lamoTRIgine (LAMICTAL) 100 MG tablet   Hyperlipidemia    Tolerates pravastatin. Await labs and adjust dose if needed. Healthy lifestyle choices encouraged.      Relevant Medications   pravastatin (PRAVACHOL) 20 MG tablet   Other Relevant Orders   Comprehensive metabolic panel   Mood disorder (HCC)    Stable. Has continued on lamotrigine for seizure prevention.  If tapers off, would recommend careful monitoring of mood.       Other Visit Diagnoses    Annual physical exam    -  Primary   Age appropriate health guidance provided.   Elevated hemoglobin A1c       Relevant Orders   Hemoglobin A1c   Screening for thyroid disorder       Relevant Orders   TSH   Screening for metabolic disorder       Relevant Orders   CBC with Differential/Platelet   Lipid panel   Urinalysis, dipstick only   Screening for iron deficiency anemia       Relevant Orders   CBC with Differential/Platelet   Screen for sexually transmitted diseases       Relevant Orders   GC/Chlamydia Probe Amp   RPR   Screening for HIV (human immunodeficiency virus)       Relevant Orders   HIV antibody       Return in about 6 months (around 10/15/2018) for re-evalaution of cholesterol and fasting labs.   Fernande Bras, PA-C [facility]

## 2018-04-14 NOTE — Assessment & Plan Note (Signed)
Well controlled on lamotrigine. Is not following with neurology. Continue current treatment.

## 2018-04-14 NOTE — Assessment & Plan Note (Signed)
Stable. Has continued on lamotrigine for seizure prevention.  If tapers off, would recommend careful monitoring of mood.

## 2018-04-14 NOTE — Progress Notes (Signed)
Subjective:    Patient ID: Brett [name], male    DOB: 12-20-1975, 42 y.o.   MRN: [id number]  HPI   42 yo male with history of HLD, Seizure disorder and unspecified mood disorder presents for annual physical exam.  He is doing well with no concerns today.   Colorectal Cancer Screening: Not yet candidate. Prostate Cancer Screening: Not yet candidate. Bone Density Testing: Not yet candidate. HIV Screening: Completed 02/11/2016, recheck today. STI Screening: GC/Chl, HIV, RPR today Seasonal Influenza Vaccination: Due 09/14/2018, no vaccine this year. Last 02/11/2016 Td/Tdap Vaccination: Next due 10/23/2020 Pneumococcal Vaccination: Not yet candidate Zoster Vaccination: Not yet candidate Frequency of Dental evaluation: Q3 months. Frequency of Eye evaluation: "Never been to an eye doctor"   Review of Systems  Respiratory: Negative for shortness of breath.   Cardiovascular: Negative for chest pain, palpitations and leg swelling.  Neurological: Negative for dizziness and headaches.       Patient Active Problem List   Diagnosis Date Noted  . Seizure disorder (HCC) 09/27/2012  . Hyperlipidemia 09/27/2012  . Mood disorder (HCC) 09/27/2012    Past Medical History:  Diagnosis Date  . Hyperlipidemia   . Mood disorder (HCC)   . Seizures (HCC)    hx of seizures as child, last sz > 15 years ago    Prior to Admission medications   Medication Sig Start Date End Date Taking? Authorizing Provider  lamoTRIgine (LAMICTAL) 100 MG tablet TAKE 1 TABLET BY MOUTH EVERY DAY. 01/12/18  Yes Jeffery, Chelle, PA-C  pravastatin (PRAVACHOL) 20 MG tablet Take 1 tablet (20 mg total) by mouth daily. 01/12/18  Yes Jeffery, Chelle, PA-C    No Known Allergies  Family History  Problem Relation Age of Onset  . Diabetes Mother   . Heart disease Mother   . Mental illness Mother        depression  . Seizures Father        possibly related to neurocystercercosis   Social History   Tobacco Use  . Smoking  status: Never Smoker  . Smokeless tobacco: Never Used  Substance Use Topics  . Alcohol use: Yes    Alcohol/week: 1.2 oz    Types: 2 Cans of beer per week    Comment: sometimes  . Drug use: No      Objective:   Physical Exam  Constitutional: He is oriented to person, place, and time. He appears well-developed and well-nourished. No distress.  BP 138/82   Pulse 69   Temp 97.8 F (36.6 C)   Resp 16   Ht  (1.727 m)   Wt 173 lb 12.8 oz (78.8 kg)   SpO2 98%   BMI 26.43 kg/m    HENT:  Head: Normocephalic and atraumatic.  Right Ear: External ear normal.  Left Ear: External ear normal.  Nose: Nose normal.  Mouth/Throat: Oropharynx is clear and moist. No oropharyngeal exudate.  Eyes: Pupils are equal, round, and reactive to light. Conjunctivae and EOM are normal. Right eye exhibits no discharge. Left eye exhibits no discharge.  Neck: Normal range of motion. Neck supple. No tracheal deviation present. No thyromegaly present.  Cardiovascular: Normal rate, regular rhythm, normal heart sounds and intact distal pulses. Exam reveals no gallop and no friction rub.  No murmur heard. Pulmonary/Chest: Effort normal and breath sounds normal.  Abdominal: Soft. Bowel sounds are normal. He exhibits no distension and no mass. There is no tenderness. There is no rebound and no guarding. No hernia.  Musculoskeletal: Normal  range of motion. He exhibits no edema.  Lymphadenopathy:    He has no cervical adenopathy.  Neurological: He is alert and oriented to person, place, and time. He displays normal reflexes. No cranial nerve deficit. He exhibits normal muscle tone. Coordination normal.  Skin: Skin is warm and dry. No rash noted. He is not diaphoretic. No erythema.  Psychiatric: He has a normal mood and affect. His behavior is normal.   Wt Readings from Last 3 Encounters:  04/14/18 173 lb 12.8 oz (78.8 kg)  01/12/18 177 lb 9.6 oz (80.6 kg)  08/18/16 176 lb (79.8 kg)       Assessment & [name]:   1. Annual physical exam Age appropriate guidance provided  2. Hyperlipidemia, unspecified hyperlipidemia type Await labs and f/u  - Comprehensive metabolic panel  3. Elevated hemoglobin A1c Await labs and f/u - Hemoglobin A1c  4. Screening for thyroid disorder Await labs and f/u - TSH  5. Screening for metabolic disorder Await labs and f/u. Adjust pravastatin dosing or change med as needed based on labs - CBC with Differential/Platelet - Lipid panel - Urinalysis, dipstick only  6. Screening for iron deficiency anemia Await labs and f/u - CBC with Differential/Platelet  7. Screen for sexually transmitted diseases Await labs and f/u - GC/Chlamydia Probe Amp - RPR  8. Screening for HIV (human immunodeficiency virus) Await labs and f/u  - HIV antibody

## 2018-04-15 LAB — CBC WITH DIFFERENTIAL/PLATELET
BASOS ABS: 0 10*3/uL (ref 0.0–0.2)
Basos: 1 %
EOS (ABSOLUTE): 0.1 10*3/uL (ref 0.0–0.4)
Eos: 3 %
HEMOGLOBIN: 15.7 g/dL (ref 13.0–17.7)
Hematocrit: 46.3 % (ref 37.5–51.0)
IMMATURE GRANS (ABS): 0 10*3/uL (ref 0.0–0.1)
IMMATURE GRANULOCYTES: 0 %
LYMPHS: 34 %
Lymphocytes Absolute: 1.2 10*3/uL (ref 0.7–3.1)
MCH: 30.6 pg (ref 26.6–33.0)
MCHC: 33.9 g/dL (ref 31.5–35.7)
MCV: 90 fL (ref 79–97)
MONOCYTES: 8 %
Monocytes Absolute: 0.3 10*3/uL (ref 0.1–0.9)
NEUTROS ABS: 2 10*3/uL (ref 1.4–7.0)
NEUTROS PCT: 54 %
PLATELETS: 173 10*3/uL (ref 150–379)
RBC: 5.13 x10E6/uL (ref 4.14–5.80)
RDW: 13.3 % (ref 12.3–15.4)
WBC: 3.6 10*3/uL (ref 3.4–10.8)

## 2018-04-15 LAB — COMPREHENSIVE METABOLIC PANEL
ALBUMIN: 4.7 g/dL (ref 3.5–5.5)
ALT: 38 IU/L (ref 0–44)
AST: 31 IU/L (ref 0–40)
Albumin/Globulin Ratio: 2 (ref 1.2–2.2)
Alkaline Phosphatase: 63 IU/L (ref 39–117)
BILIRUBIN TOTAL: 1.3 mg/dL — AB (ref 0.0–1.2)
BUN/Creatinine Ratio: 21 — ABNORMAL HIGH (ref 9–20)
BUN: 18 mg/dL (ref 6–24)
CALCIUM: 9.6 mg/dL (ref 8.7–10.2)
CHLORIDE: 101 mmol/L (ref 96–106)
CO2: 25 mmol/L (ref 20–29)
Creatinine, Ser: 0.85 mg/dL (ref 0.76–1.27)
GFR calc Af Amer: 125 mL/min/{1.73_m2} (ref >59)
GFR calc non Af Amer: 108 mL/min/{1.73_m2} (ref >59)
GLOBULIN, TOTAL: 2.4 g/dL (ref 1.5–4.5)
Glucose: 111 mg/dL — ABNORMAL HIGH (ref 65–99)
POTASSIUM: 4.6 mmol/L (ref 3.5–5.2)
Sodium: 140 mmol/L (ref 134–144)
TOTAL PROTEIN: 7.1 g/dL (ref 6.0–8.5)

## 2018-04-15 LAB — LIPID PANEL
CHOLESTEROL TOTAL: 226 mg/dL — AB (ref 100–199)
Chol/HDL Ratio: 4.6 ratio (ref 0.0–5.0)
HDL: 49 mg/dL (ref >39)
LDL CALC: 153 mg/dL — AB (ref 0–99)
TRIGLYCERIDES: 121 mg/dL (ref 0–149)
VLDL CHOLESTEROL CAL: 24 mg/dL (ref 5–40)

## 2018-04-15 LAB — GC/CHLAMYDIA PROBE AMP
CHLAMYDIA, DNA PROBE: NEGATIVE
Neisseria gonorrhoeae by PCR: NEGATIVE

## 2018-04-15 LAB — RPR: RPR Ser Ql: NONREACTIVE

## 2018-04-15 LAB — URINALYSIS, DIPSTICK ONLY
Bilirubin, UA: NEGATIVE
Glucose, UA: NEGATIVE
Ketones, UA: NEGATIVE
LEUKOCYTES UA: NEGATIVE
NITRITE UA: NEGATIVE
PH UA: 6.5 (ref 5.0–7.5)
Protein, UA: NEGATIVE
RBC UA: NEGATIVE
Specific Gravity, UA: >=1.03 — AB (ref 1.005–1.030)
Urobilinogen, Ur: 0.2 mg/dL (ref 0.2–1.0)

## 2018-04-15 LAB — HIV ANTIBODY (ROUTINE TESTING W REFLEX): HIV Screen 4th Generation wRfx: NONREACTIVE

## 2018-04-15 LAB — TSH: TSH: 1.73 u[IU]/mL (ref 0.450–4.500)

## 2018-04-15 LAB — HEMOGLOBIN A1C
ESTIMATED AVERAGE GLUCOSE: 120 mg/dL
HEMOGLOBIN A1C: 5.8 % — AB (ref 4.8–5.6)

## 2018-05-15 ENCOUNTER — Encounter: Payer: Self-pay | Admitting: Physician Assistant

## 2018-05-15 ENCOUNTER — Other Ambulatory Visit: Payer: Self-pay | Admitting: Physician Assistant

## 2018-05-15 DIAGNOSIS — E785 Hyperlipidemia, unspecified: Secondary | ICD-10-CM

## 2018-05-15 DIAGNOSIS — R7303 Prediabetes: Secondary | ICD-10-CM | POA: Insufficient documentation

## 2018-05-15 MED ORDER — PRAVASTATIN SODIUM 40 MG PO TABS: 20.0000 mg | ORAL_TABLET | Freq: Every day | ORAL | 3 refills | Status: AC

## 2018-05-16 ENCOUNTER — Encounter: Payer: Self-pay | Admitting: *Deleted

## 2019-11-04 ENCOUNTER — Other Ambulatory Visit: Payer: Self-pay

## 2019-11-04 ENCOUNTER — Encounter (HOSPITAL_COMMUNITY): Payer: Self-pay | Admitting: *Deleted

## 2019-11-04 ENCOUNTER — Ambulatory Visit (HOSPITAL_COMMUNITY)
Admission: EM | Admit: 2019-11-04 | Discharge: 2019-11-04 | Disposition: A | Discharge status: Home / Self Care | Payer: BC Managed Care – PPO

## 2019-11-04 DIAGNOSIS — L02213 Cutaneous abscess of chest wall: Secondary | ICD-10-CM

## 2019-11-04 DIAGNOSIS — L0291 Cutaneous abscess, unspecified: Secondary | ICD-10-CM

## 2019-11-04 HISTORY — DX: Essential (primary) hypertension: I10

## 2019-11-04 MED ORDER — IBUPROFEN 800 MG PO TABS
ORAL_TABLET | ORAL | Status: AC
  Filled 2019-11-04: qty 1

## 2019-11-04 MED ORDER — LIDOCAINE-EPINEPHRINE (PF) 2 %-1:200000 IJ SOLN
INTRAMUSCULAR | Status: AC
  Filled 2019-11-04: qty 20

## 2019-11-04 MED ORDER — CEPHALEXIN 500 MG PO CAPS: 500.0000 mg | ORAL_CAPSULE | Freq: Three times a day (TID) | ORAL | 0 refills | Status: AC

## 2019-11-04 MED ORDER — IBUPROFEN 800 MG PO TABS
800.0000 mg | ORAL_TABLET | Freq: Once | ORAL | Status: AC
  Administered 2019-11-04: 800 mg via ORAL

## 2019-11-04 NOTE — ED Provider Notes (Signed)
[facility]    CSN: [id number] Arrival date & time: 11/04/19  1134      History   Chief Complaint Chief Complaint  Patient presents with  . Appointment    1150  . Abscess    HPI ISMAEEL ARVELO is a 43 y.o. male.   Jaylyn A Dolinger presents with complaints of abscess under right arm. Noted it first 4 days ago, appeared like a small pimple. Tried to squeeze and drain it without much out. Has since increased in size and pain, no further drainage. Denies  Any previous similar. He felt feverish at one point, this has resolved. Hasn't taken any medications for symptoms. No known MRSA.    ROS per HPI, negative if not otherwise mentioned.      Past Medical History:  Diagnosis Date  . Hyperlipidemia   . Hypertension   . Mood disorder (HCC)   . Seizures (HCC)    hx of seizures as child, last sz > 15 years ago    Patient Active Problem List   Diagnosis Date Noted  . Prediabetes 05/15/2018  . Seizure disorder (HCC) 09/27/2012  . Hyperlipidemia 09/27/2012  . Mood disorder (HCC) 09/27/2012    Past Surgical History:  Procedure Laterality Date  . APPENDECTOMY  2003       Home Medications    Prior to Admission medications   Medication Sig Start Date End Date Taking? Authorizing Provider  lamoTRIgine (LAMICTAL) 100 MG tablet TAKE 1 TABLET BY MOUTH EVERY DAY. 04/14/18  Yes Jeffery, Chelle, PA  pravastatin (PRAVACHOL) 40 MG tablet Take 0.5 tablets (20 mg total) by mouth daily. 05/15/18  Yes Jeffery, Avelino Leeds, PA  UNKNOWN TO PATIENT HTN med; unk name   Yes [provider]  cephALEXin (KEFLEX) 500 MG capsule Take 1 capsule (500 mg total) by mouth 3 (three) times daily for 7 days. 11/04/19 11/11/19  Georgetta Haber, NP    Family History Family History  Problem Relation Age of Onset  . Diabetes Mother   . Heart disease Mother   . Mental illness Mother        depression  . Seizures Father        possibly related to neurocystercercosis    Social History  Social History   Tobacco Use  . Smoking status: Never Smoker  . Smokeless tobacco: Never Used  Substance Use Topics  . Alcohol use: Not Currently  . Drug use: Never     Allergies   Patient has no known allergies.   Review of Systems Review of Systems   Physical Exam Triage Vital Signs ED Triage Vitals  Enc Vitals Group     BP 11/04/19 1158 (!) 141/87     Pulse Rate 11/04/19 1158 84     Resp 11/04/19 1158 16     Temp 11/04/19 1158 98.1 F (36.7 C)     Temp Source 11/04/19 1158 Oral     SpO2 11/04/19 1158 99 %     Weight --      Height --      Head Circumference --      Peak Flow --      Pain Score 11/04/19 1200 6     Pain Loc --      Pain Edu? --      Excl. in GC? --    No data found.  Updated Vital Signs BP (!) 141/87   Pulse 84   Temp 98.1 F (36.7 C) (Oral)   Resp 16  SpO2 99%   Visual Acuity Right Eye Distance:   Left Eye Distance:   Bilateral Distance:    Right Eye Near:   Left Eye Near:    Bilateral Near:     Physical Exam Constitutional:      Appearance: He is well-developed.  Cardiovascular:     Rate and Rhythm: Normal rate.  Pulmonary:     Effort: Pulmonary effort is normal.  Skin:    General: Skin is warm and dry.          Comments: Approximately 4 cm in length abscess under right arm, distal to axilla; tender; mildly fluctuant; no drainage   Neurological:     Mental Status: He is alert and oriented to person, place, and time.      UC Treatments / Results  Labs (all labs ordered are listed, but only abnormal results are displayed) Labs Reviewed - No data to display  EKG   Radiology No results found.  Procedures Incision and Drainage  Date/Time: 11/04/2019 1:20 PM Performed by: Zigmund Gottron, NP Authorized by: Zigmund Gottron, NP   Consent:    Consent obtained:  Verbal   Consent given by:  Patient   Risks discussed:  Bleeding, incomplete drainage, pain and infection   Alternatives discussed:  No treatment,  observation and referral Location:    Type:  Abscess   Size:  4   Location:  Trunk   Trunk location:  Chest (right lateral chest; distal axilla) Pre-procedure details:    Skin preparation:  Betadine Anesthesia (see MAR for exact dosages):    Anesthesia method:  Local infiltration   Local anesthetic:  Lidocaine 2% WITH epi Procedure details:    Incision types:  Single straight   Scalpel blade:  11   Wound management:  Probed and deloculated   Drainage:  Purulent and bloody   Drainage amount:  Moderate   Wound treatment:  Wound left open   Packing materials:  None Post-procedure details:    Patient tolerance of procedure:  Tolerated well, no immediate complications   (including critical care time)  Medications Ordered in UC Medications  ibuprofen (ADVIL) tablet 800 mg (800 mg Oral Given 11/04/19 1236)  lidocaine-EPINEPHrine (XYLOCAINE W/EPI) 2 %-1:200000 (PF) injection (has no administration in time range)  ibuprofen (ADVIL) 800 MG tablet (has no administration in time range)    Initial Impression / Assessment and Plan / UC Course  I have reviewed the triage vital signs and the nursing notes.  Pertinent labs & imaging results that were available during my care of the patient were reviewed by me and considered in my medical decision making (see chart for details).     Incision and drainage of abscess under right axilla. Patient tolerated well. Course of keflex provided. I&D instructions provided. Return precautions provided. Patient verbalized understanding and agreeable to plan.   Final Clinical Impressions(s) / UC Diagnoses   Final diagnoses:  Abscess     Discharge Instructions     Keep dressing in place for the next 24 hours. May remove tomorrow.  Then cleanse area daily with soap and water.  Keep covered to keep protected and collect drainage.   Apply warm compresses to the area 3-4 times a day to promote drainage.  Ibuprofen 800mg  every 8 hours as needed for  pain.  Complete course of antibiotics.  If symptoms worsen or do not improve in the next week to return to be seen or to follow up with your PCP.  ED Prescriptions    Medication Sig Dispense Auth. Provider   cephALEXin (KEFLEX) 500 MG capsule Take 1 capsule (500 mg total) by mouth 3 (three) times daily for 7 days. 21 capsule Georgetta HaberBurky, Zhuri Krass B, NP     PDMP not reviewed this encounter.   Georgetta HaberBurky, Natasa Stigall B, NP 11/04/19 1321

## 2019-11-04 NOTE — Discharge Instructions (Signed)
Keep dressing in place for the next 24 hours. May remove tomorrow.  Then cleanse area daily with soap and water.  Keep covered to keep protected and collect drainage.   Apply warm compresses to the area 3-4 times a day to promote drainage.  Ibuprofen 800mg  every 8 hours as needed for pain.  Complete course of antibiotics.  If symptoms worsen or do not improve in the next week to return to be seen or to follow up with your PCP.

## 2019-11-04 NOTE — ED Triage Notes (Signed)
Pt reports abscess to right axillary region x 4 days with progressive worsening.  States felt chills 4 days ago, which have resolved.

## 2021-01-23 ENCOUNTER — Emergency Department (HOSPITAL_COMMUNITY): Payer: BC Managed Care – PPO

## 2021-01-23 ENCOUNTER — Emergency Department (HOSPITAL_COMMUNITY)
Admission: EM | Admit: 2021-01-23 | Discharge: 2021-01-23 | Disposition: A | Discharge status: Home / Self Care | Payer: BC Managed Care – PPO | Attending: Emergency Medicine | Admitting: Emergency Medicine

## 2021-01-23 ENCOUNTER — Other Ambulatory Visit: Payer: Self-pay

## 2021-01-23 ENCOUNTER — Encounter (HOSPITAL_COMMUNITY): Payer: Self-pay

## 2021-01-23 DIAGNOSIS — F419 Anxiety disorder, unspecified: Secondary | ICD-10-CM | POA: Insufficient documentation

## 2021-01-23 DIAGNOSIS — R0789 Other chest pain: Secondary | ICD-10-CM | POA: Insufficient documentation

## 2021-01-23 DIAGNOSIS — F439 Reaction to severe stress, unspecified: Secondary | ICD-10-CM | POA: Diagnosis not present

## 2021-01-23 DIAGNOSIS — R079 Chest pain, unspecified: Secondary | ICD-10-CM

## 2021-01-23 DIAGNOSIS — I10 Essential (primary) hypertension: Secondary | ICD-10-CM | POA: Diagnosis not present

## 2021-01-23 LAB — CBC
HCT: 45 % (ref 39.0–52.0)
Hemoglobin: 15.8 g/dL (ref 13.0–17.0)
MCH: 31.1 pg (ref 26.0–34.0)
MCHC: 35.1 g/dL (ref 30.0–36.0)
MCV: 88.6 fL (ref 80.0–100.0)
Platelets: 152 10*3/uL (ref 150–400)
RBC: 5.08 MIL/uL (ref 4.22–5.81)
RDW: 12.1 % (ref 11.5–15.5)
WBC: 4.3 10*3/uL (ref 4.0–10.5)
nRBC: 0 % (ref 0.0–0.2)

## 2021-01-23 LAB — TROPONIN I (HIGH SENSITIVITY)
Troponin I (High Sensitivity): 3 ng/L (ref <18)
Troponin I (High Sensitivity): 3 ng/L (ref <18)

## 2021-01-23 LAB — BASIC METABOLIC PANEL
Anion gap: 8 (ref 5–15)
BUN: 16 mg/dL (ref 6–20)
CO2: 26 mmol/L (ref 22–32)
Calcium: 9.3 mg/dL (ref 8.9–10.3)
Chloride: 104 mmol/L (ref 98–111)
Creatinine, Ser: 0.93 mg/dL (ref 0.61–1.24)
GFR, Estimated: >60 mL/min (ref >60)
Glucose, Bld: 127 mg/dL — ABNORMAL HIGH (ref 70–99)
Potassium: 3.9 mmol/L (ref 3.5–5.1)
Sodium: 138 mmol/L (ref 135–145)

## 2021-01-23 MED ORDER — ALUM & MAG HYDROXIDE-SIMETH 200-200-20 MG/5ML PO SUSP
30.0000 mL | Freq: Once | ORAL | Status: AC
  Administered 2021-01-23: 30 mL via ORAL
  Filled 2021-01-23: qty 30

## 2021-01-23 MED ORDER — PANTOPRAZOLE SODIUM 20 MG PO TBEC: 20.0000 mg | DELAYED_RELEASE_TABLET | Freq: Every day | ORAL | 0 refills | Status: AC

## 2021-01-23 MED ORDER — ASPIRIN 81 MG PO CHEW
324.0000 mg | CHEWABLE_TABLET | Freq: Once | ORAL | Status: AC
  Administered 2021-01-23: 324 mg via ORAL
  Filled 2021-01-23: qty 4

## 2021-01-23 MED ORDER — LIDOCAINE VISCOUS HCL 2 % MT SOLN
15.0000 mL | Freq: Once | OROMUCOSAL | Status: AC
  Administered 2021-01-23: 15 mL via ORAL
  Filled 2021-01-23: qty 15

## 2021-01-23 MED ORDER — HYDROXYZINE HCL 25 MG PO TABS: 25.0000 mg | ORAL_TABLET | Freq: Three times a day (TID) | ORAL | 0 refills | Status: AC | PRN

## 2021-01-23 NOTE — Discharge Instructions (Signed)
Follow-up with your primary care doctor for further evaluation.  Consider outpatient stress testing.  Discussed further treatment about stress and anxiety.  Return as needed for worsening symptoms.

## 2021-01-23 NOTE — ED Triage Notes (Signed)
Patient reports chest pain x 1 week, states it is intermittent in nature, L sided that radiates into L arm, denies cardiac hx, states it feels like pressure

## 2021-01-23 NOTE — ED Provider Notes (Signed)
[facility] EMERGENCY DEPARTMENT Provider Note   CSN: [id number] Arrival date & time: 01/23/21  3762     History Chief Complaint  Patient presents with  . Chest Pain    Brett [name] is a 45 y.o. male.  HPI  HPI: A 45 year old patient with a history of hypertension and hypercholesterolemia presents for evaluation of chest pain. Initial onset of pain was more than 6 hours ago. The patient's chest pain is described as heaviness/pressure/tightness and is not worse with exertion. The patient's chest pain is middle- or left-sided, is not well-localized, is not sharp and does radiate to the arms/jaw/neck. The patient does not complain of nausea and denies diaphoresis. The patient has a family history of coronary artery disease in a first-degree relative with onset less than age 39. The patient has no history of stroke, has no history of peripheral artery disease, has not smoked in the past 90 days, denies any history of treated diabetes and does not have an elevated BMI (>=30).  Patient states he has had some issues with recent stress at home.  He is not sure if that could be a factor.  He does not have a history of heart disease.  He thinks his mother may have had issues with her heart in her 37s Past Medical History:  Diagnosis Date  . Hyperlipidemia   . Hypertension   . Mood disorder (HCC)   . Seizures (HCC)    hx of seizures as child, last sz > 15 years ago    Patient Active Problem List   Diagnosis Date Noted  . Prediabetes 05/15/2018  . Seizure disorder (HCC) 09/27/2012  . Hyperlipidemia 09/27/2012  . Mood disorder (HCC) 09/27/2012    Past Surgical History:  Procedure Laterality Date  . APPENDECTOMY  2003       Family History  Problem Relation Age of Onset  . Diabetes Mother   . Heart disease Mother   . Mental illness Mother        depression  . Seizures Father        possibly related to neurocystercercosis    Social History   Tobacco Use  .  Smoking status: Never Smoker  . Smokeless tobacco: Never Used  Vaping Use  . Vaping Use: Never used  Substance Use Topics  . Alcohol use: Not Currently  . Drug use: Never    Home Medications Prior to Admission medications   Medication Sig Start Date End Date Taking? Authorizing Provider  hydrOXYzine (ATARAX/VISTARIL) 25 MG tablet Take 1 tablet (25 mg total) by mouth every 8 (eight) hours as needed for anxiety (stress). 01/23/21  Yes Linwood Dibbles, MD  pantoprazole (PROTONIX) 20 MG tablet Take 1 tablet (20 mg total) by mouth daily. 01/23/21  Yes Linwood Dibbles, MD  lamoTRIgine (LAMICTAL) 100 MG tablet TAKE 1 TABLET BY MOUTH EVERY DAY. 04/14/18   Porfirio Oar, PA  pravastatin (PRAVACHOL) 40 MG tablet Take 0.5 tablets (20 mg total) by mouth daily. 05/15/18   Porfirio Oar, PA  UNKNOWN TO PATIENT HTN med; unk name    [provider]    Allergies    Patient has no known allergies.  Review of Systems   Review of Systems  All other systems reviewed and are negative.   Physical Exam Updated Vital Signs BP 118/82   Pulse 64   Temp 98.6 F (37 C) (Oral)   Resp (!) 22   Ht 1.727 m (5\' 8" )   Wt 78.8 kg  SpO2 99%   BMI 26.41 kg/m   Physical Exam Vitals and nursing note reviewed.  Constitutional:      General: He is not in acute distress.    Appearance: He is well-developed and well-nourished.  HENT:     Head: Normocephalic and atraumatic.     Right Ear: External ear normal.     Left Ear: External ear normal.  Eyes:     General: No scleral icterus.       Right eye: No discharge.        Left eye: No discharge.     Conjunctiva/sclera: Conjunctivae normal.  Neck:     Trachea: No tracheal deviation.  Cardiovascular:     Rate and Rhythm: Normal rate and regular rhythm.     Pulses: Intact distal pulses.  Pulmonary:     Effort: Pulmonary effort is normal. No respiratory distress.     Breath sounds: Normal breath sounds. No stridor. No wheezing or rales.  Abdominal:      General: Bowel sounds are normal. There is no distension.     Palpations: Abdomen is soft.     Tenderness: There is no abdominal tenderness. There is no guarding or rebound.  Musculoskeletal:        General: No tenderness or edema.     Cervical back: Neck supple.  Skin:    General: Skin is warm and dry.     Findings: No rash.  Neurological:     Mental Status: He is alert.     Cranial Nerves: No cranial nerve deficit (no facial droop, extraocular movements intact, no slurred speech).     Sensory: No sensory deficit.     Motor: No abnormal muscle tone or seizure activity.     Coordination: Coordination normal.     Deep Tendon Reflexes: Strength normal.  Psychiatric:        Mood and Affect: Mood and affect normal.     ED Results / Procedures / Treatments   Labs (all labs ordered are listed, but only abnormal results are displayed) Labs Reviewed  BASIC METABOLIC PANEL - Abnormal; Notable for the following components:      Result Value   Glucose, Bld 127 (*)    All other components within normal limits  CBC  TROPONIN I (HIGH SENSITIVITY)  TROPONIN I (HIGH SENSITIVITY)    EKG EKG Interpretation  Date/Time:  Thursday January 23 2021 06:57:22 EST Ventricular Rate:  81 PR Interval:  156 QRS Duration: 102 QT Interval:  364 QTC Calculation: 422 R Axis:     Text Interpretation: Normal sinus rhythm Minimal voltage criteria for LVH, may be normal variant ( Cornell product ) Borderline ECG No previous tracing Confirmed by Linwood Dibbles [phone]) on 01/23/2021 9:01:52 AM   Radiology DG Chest 2 View  Result Date: 01/23/2021 CLINICAL DATA:  Chest pain. EXAM: CHEST - 2 VIEW COMPARISON:  11/12/2009. FINDINGS: Mediastinum and hilar structures normal. Heart size normal. No focal infiltrate. Mild elevation left hemidiaphragm. No pleural effusion or pneumothorax. IMPRESSION: No acute cardiopulmonary disease. Electronically Signed   By: Maisie Fus  Register   On: 01/23/2021 07:43     Procedures Procedures   Medications Ordered in ED Medications  aspirin chewable tablet 324 mg (324 mg Oral Given 01/23/21 0913)  alum & mag hydroxide-simeth (MAALOX/MYLANTA) 200-200-20 MG/5ML suspension 30 mL (30 mLs Oral Given 01/23/21 0913)    And  lidocaine (XYLOCAINE) 2 % viscous mouth solution 15 mL (15 mLs Oral Given 01/23/21 0913)    ED Course  I have reviewed the triage vital signs and the nursing notes.  Pertinent labs & imaging results that were available during my care of the patient were reviewed by me and considered in my medical decision making (see chart for details).    MDM Rules/Calculators/A&P HEAR Score: 4                        Patient presented to the ED for evaluation of chest pain.  Patient has a moderate risk heart score.  Symptoms however have been ongoing for several days.  Serial troponins are normal.  Overall doubt symptoms are related to ACS.  Patient has not had any shortness of breath.  Low risk for PE.  PERC negative .  Symptoms and presentation not suggestive of aortic dissection.  Patient was given antacids and has noticed some improvement.  Possible symptoms may be related to gastroesophageal reflux.  Patient also complains of some increased stress at home and this could be related to stress and anxiety.  At this point the patient appears stable for discharge and close outpatient follow-up.  Recommend follow-up with PCP.  Consider outpatient stress testing. Final Clinical Impression(s) / ED Diagnoses Final diagnoses:  Chest pain, unspecified type  Stress    Rx / DC Orders ED Discharge Orders         Ordered    hydrOXYzine (ATARAX/VISTARIL) 25 MG tablet  Every 8 hours PRN        01/23/21 1138    pantoprazole (PROTONIX) 20 MG tablet  Daily        01/23/21 1138           Linwood Dibbles, MD 01/23/21 1143

## 2022-08-31 IMAGING — DX DG CHEST 2V
2 series · 2 of 2 positions shown · non-contrast
Comparison: 11/12/2009.

CLINICAL DATA: Chest pain.

EXAM:
CHEST - 2 VIEW

[chest pa]
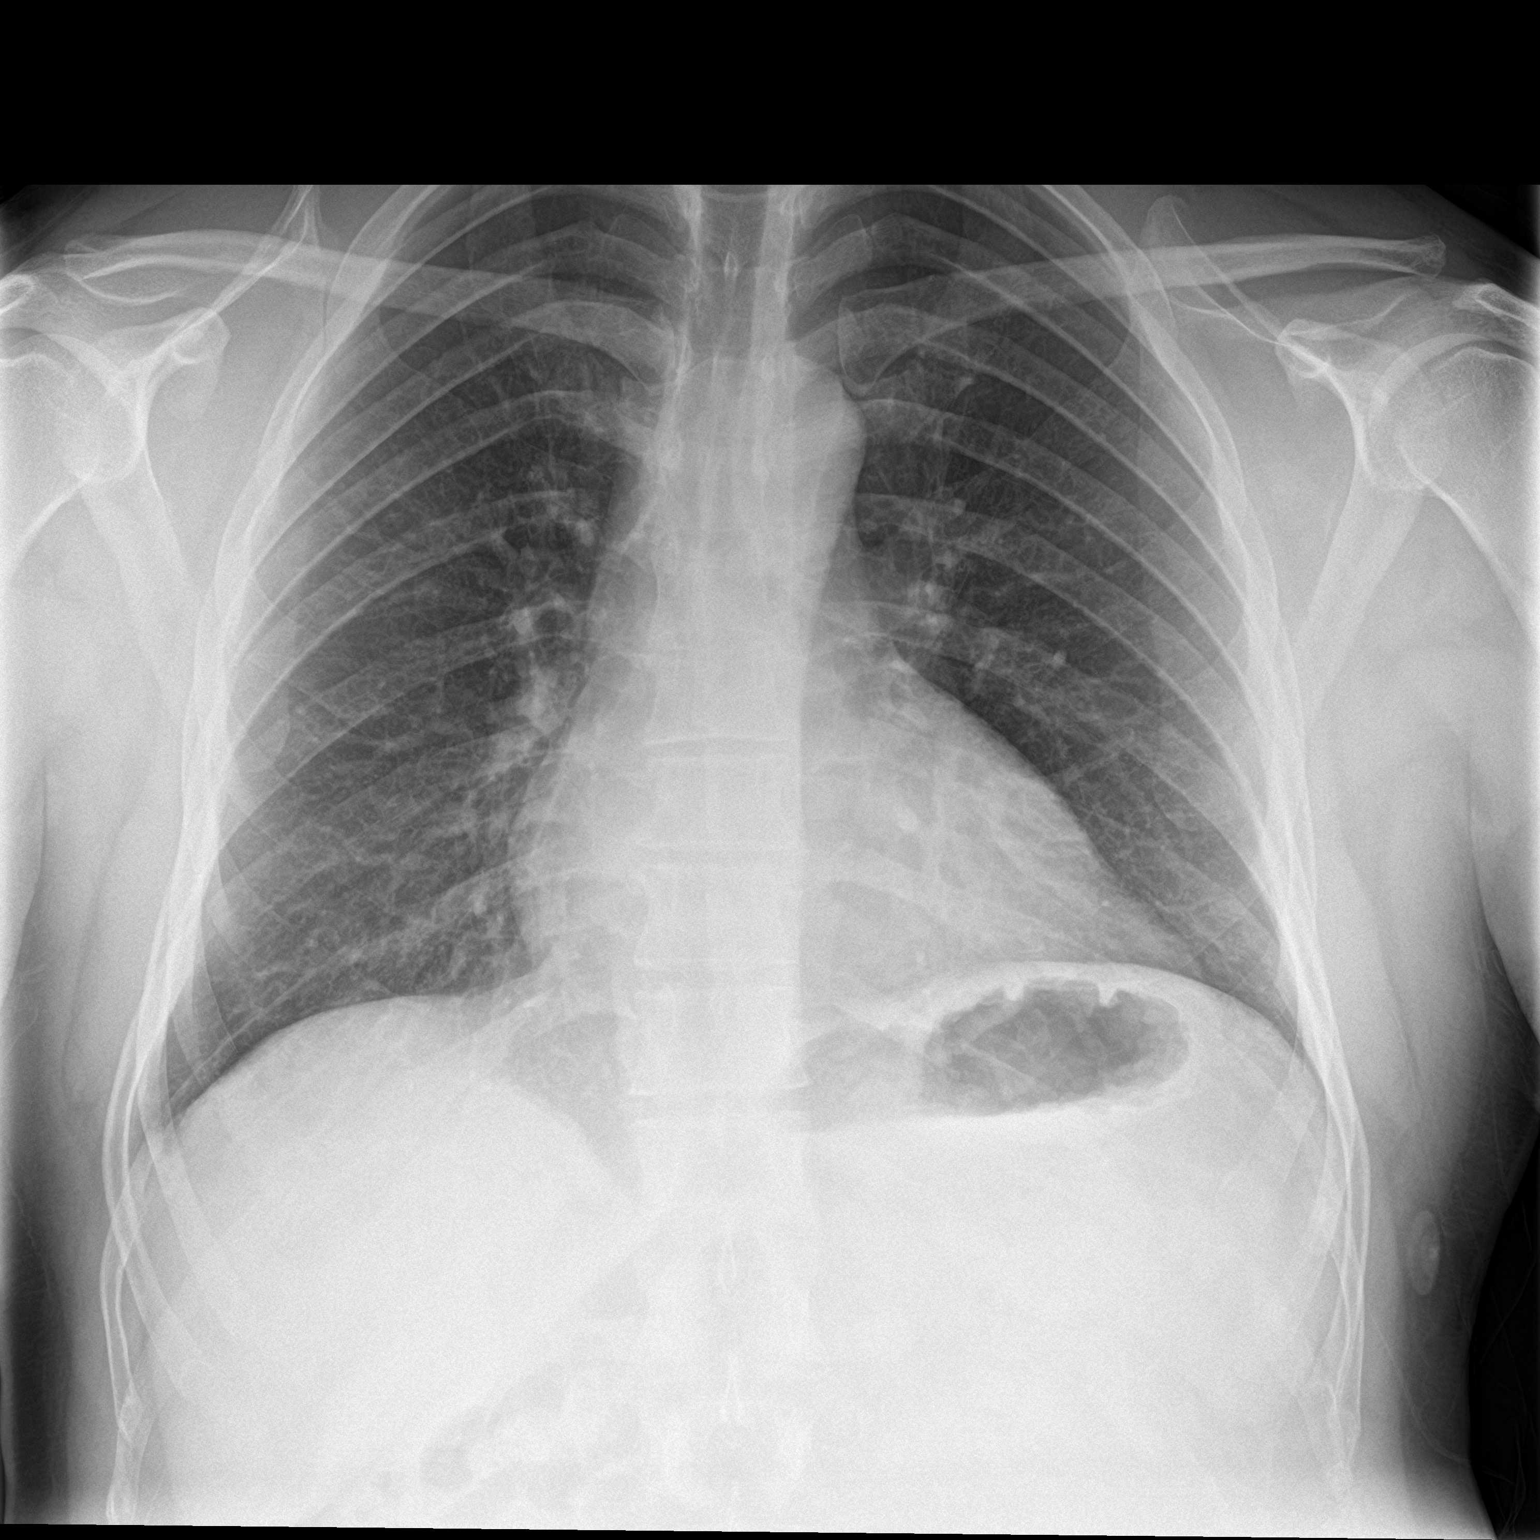

[chest lat]
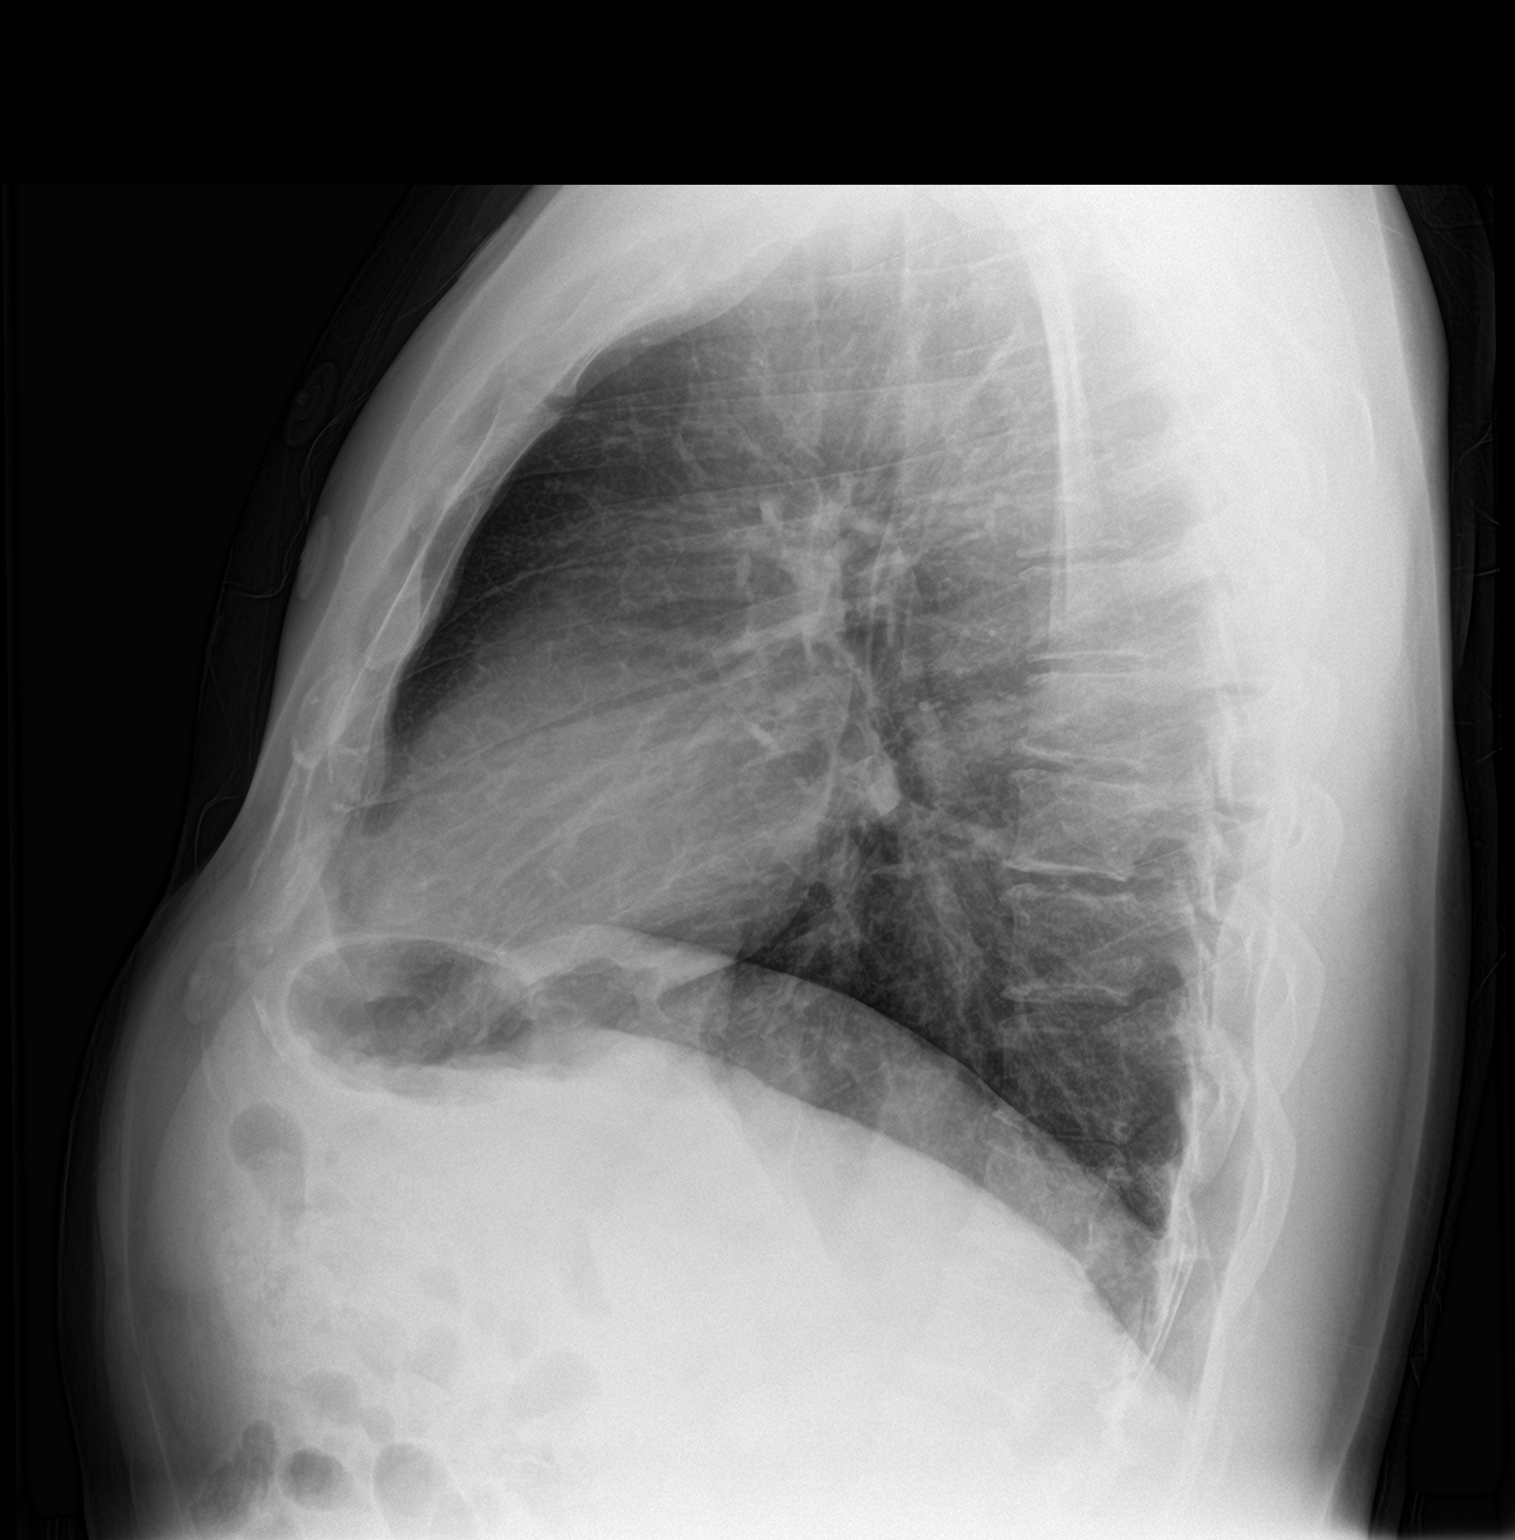

[2 of 2 positions shown; findings below may reference images not displayed]

FINDINGS: Mediastinum and hilar structures normal. Heart size normal. No focal
infiltrate. Mild elevation left hemidiaphragm. No pleural effusion
or pneumothorax.
IMPRESSION: No acute cardiopulmonary disease.
